# Patient Record
Sex: Female | Born: 1965
Health system: Southern US, Community
[De-identification: ages and names within clinical notes are randomized; demographics above are authoritative.]

## PROBLEM LIST (undated history)

## (undated) DIAGNOSIS — O24419 Gestational diabetes mellitus in pregnancy, unspecified control: Secondary | ICD-10-CM

## (undated) DIAGNOSIS — E119 Type 2 diabetes mellitus without complications: Secondary | ICD-10-CM

## (undated) DIAGNOSIS — S0500XA Injury of conjunctiva and corneal abrasion without foreign body, unspecified eye, initial encounter: Secondary | ICD-10-CM

## (undated) DIAGNOSIS — K219 Gastro-esophageal reflux disease without esophagitis: Secondary | ICD-10-CM

## (undated) DIAGNOSIS — M8580 Other specified disorders of bone density and structure, unspecified site: Secondary | ICD-10-CM

## (undated) DIAGNOSIS — K635 Polyp of colon: Secondary | ICD-10-CM

## (undated) HISTORY — DX: Other specified disorders of bone density and structure, unspecified site: M85.80

## (undated) HISTORY — DX: Injury of conjunctiva and corneal abrasion without foreign body, unspecified eye, initial encounter: S05.00XA

## (undated) HISTORY — DX: Gastro-esophageal reflux disease without esophagitis: K21.9

## (undated) HISTORY — DX: Gestational diabetes mellitus in pregnancy, unspecified control: O24.419

## (undated) HISTORY — DX: Type 2 diabetes mellitus without complications: E11.9

## (undated) HISTORY — PX: COLONOSCOPY: SHX174

## (undated) HISTORY — PX: NASAL SEPTUM SURGERY: SHX37

## (undated) HISTORY — DX: Polyp of colon: K63.5

---

## 2002-10-06 ENCOUNTER — Other Ambulatory Visit: Admission: RE | Admit: 2002-10-06 | Discharge: 2002-10-06 | Payer: Self-pay | Admitting: Obstetrics & Gynecology

## 2003-10-26 ENCOUNTER — Other Ambulatory Visit: Admission: RE | Admit: 2003-10-26 | Discharge: 2003-10-26 | Payer: Self-pay | Admitting: Obstetrics & Gynecology

## 2007-07-06 ENCOUNTER — Other Ambulatory Visit: Admission: RE | Admit: 2007-07-06 | Discharge: 2007-07-06 | Payer: Self-pay | Admitting: Gynecology

## 2007-08-19 ENCOUNTER — Ambulatory Visit (HOSPITAL_COMMUNITY): Admission: RE | Admit: 2007-08-19 | Discharge: 2007-08-19 | Payer: Self-pay | Admitting: Gynecology

## 2008-05-09 ENCOUNTER — Encounter: Admission: RE | Admit: 2008-05-09 | Discharge: 2008-05-09 | Payer: Self-pay | Admitting: Obstetrics & Gynecology

## 2008-07-16 ENCOUNTER — Inpatient Hospital Stay (HOSPITAL_COMMUNITY): Admission: AD | Admit: 2008-07-16 | Discharge: 2008-07-18 | Payer: Self-pay | Admitting: Obstetrics & Gynecology

## 2008-09-05 ENCOUNTER — Ambulatory Visit: Admission: RE | Admit: 2008-09-05 | Discharge: 2008-09-05 | Payer: Self-pay | Admitting: Obstetrics & Gynecology

## 2009-01-16 ENCOUNTER — Ambulatory Visit: Payer: Self-pay | Admitting: Internal Medicine

## 2009-01-30 ENCOUNTER — Encounter: Payer: Self-pay | Admitting: Internal Medicine

## 2009-01-30 ENCOUNTER — Ambulatory Visit: Payer: Self-pay | Admitting: Internal Medicine

## 2009-02-02 ENCOUNTER — Encounter: Payer: Self-pay | Admitting: Internal Medicine

## 2011-06-10 LAB — CBC
HCT: 38.1
HCT: 40.6
MCHC: 33.1
MCHC: 33.8
MCV: 89.5
RBC: 4.26
RDW: 14.2
RDW: 14.2

## 2011-06-10 LAB — GLUCOSE, CAPILLARY
Glucose-Capillary: 102 — ABNORMAL HIGH
Glucose-Capillary: 88

## 2011-06-10 LAB — RPR: RPR Ser Ql: NONREACTIVE

## 2011-08-30 ENCOUNTER — Emergency Department (HOSPITAL_COMMUNITY)
Admission: EM | Admit: 2011-08-30 | Discharge: 2011-08-30 | Disposition: A | Discharge status: Home / Self Care | Payer: 59 | Source: Home / Self Care

## 2011-08-30 ENCOUNTER — Encounter: Payer: Self-pay | Admitting: *Deleted

## 2011-08-30 DIAGNOSIS — S0502XA Injury of conjunctiva and corneal abrasion without foreign body, left eye, initial encounter: Secondary | ICD-10-CM

## 2011-08-30 DIAGNOSIS — S058X9A Other injuries of unspecified eye and orbit, initial encounter: Secondary | ICD-10-CM

## 2011-08-30 MED ORDER — TETANUS-DIPHTH-ACELL PERTUSSIS 5-2.5-18.5 LF-MCG/0.5 IM SUSP
0.5000 mL | Freq: Once | INTRAMUSCULAR | Status: AC
  Administered 2011-08-30: 0.5 mL via INTRAMUSCULAR

## 2011-08-30 MED ORDER — TETANUS-DIPHTH-ACELL PERTUSSIS 5-2.5-18.5 LF-MCG/0.5 IM SUSP
INTRAMUSCULAR | Status: AC
  Filled 2011-08-30: qty 0.5

## 2011-08-30 MED ORDER — HYDROCODONE-ACETAMINOPHEN 5-325 MG PO TABS: 1.0000 | ORAL_TABLET | Freq: Four times a day (QID) | ORAL | Status: AC | PRN

## 2011-08-30 MED ORDER — SULFACETAMIDE SODIUM 10 % OP OINT: TOPICAL_OINTMENT | OPHTHALMIC | Status: DC

## 2011-08-30 NOTE — ED Provider Notes (Signed)
History     CSN: [id number]  Arrival date & time 08/30/11  1720   None     Chief Complaint  Patient presents with  . Eye Injury    (Consider location/radiation/quality/duration/timing/severity/associated sxs/prior treatment) HPI Comments: Pt states just prior to arrival she was putting some laundry away at home .  She bent over and a plastic clothes hanger poked her in her Lt eye. She has eye pain since injury and vision is a little blurry. No other injury or complaints.   Patient is a 45 y.o. female presenting with eye injury.  Eye Injury Pertinent negatives include no chest pain.    History reviewed. No pertinent past medical history.  Past Surgical History  Procedure Date  . Nasal septum surgery     Family History  Problem Relation Age of Onset  . Hypertension Other   . Diabetes Other     History  Substance Use Topics  . Smoking status: Never Smoker   . Smokeless tobacco: Never Used  . Alcohol Use: Yes     Occasionally    OB History    Grav Para Term Preterm Abortions TAB SAB Ect Mult Living                  Review of Systems  HENT: Negative for ear pain and congestion.   Eyes: Positive for pain. Negative for photophobia, discharge, redness and visual disturbance.  Cardiovascular: Negative for chest pain.    Allergies  Review of patient's allergies indicates no known allergies.  Home Medications   Current Outpatient Rx  Name Route Sig Dispense Refill  . HYDROCODONE-ACETAMINOPHEN 5-325 MG PO TABS Oral Take 1 tablet by mouth every 6 (six) hours as needed for pain. 12 tablet 0  . SULFACETAMIDE SODIUM 10 % OP OINT  Instill a small ribbon of ointment into Lt lower eyelid 3-4 times a day 3.5 g 0    BP 145/85  Pulse 87  Temp(Src) 98 F (36.7 C) (Oral)  Resp 20  SpO2 100%  Physical Exam  Nursing note and vitals reviewed. Constitutional: She appears well-developed and well-nourished. No distress.  HENT:  Head: Normocephalic and atraumatic.    Right Ear: Tympanic membrane, external ear and ear canal normal.  Left Ear: Tympanic membrane, external ear and ear canal normal.  Nose: Nose normal.  Mouth/Throat: Uvula is midline, oropharynx is clear and moist and mucous membranes are normal. No oropharyngeal exudate, posterior oropharyngeal edema or posterior oropharyngeal erythema.  Eyes: Conjunctivae, EOM and lids are normal. Pupils are equal, round, and reactive to light.  Slit lamp exam:      The left eye shows corneal abrasion.    Neck: Neck supple.  Cardiovascular: Normal rate, regular rhythm and normal heart sounds.   Pulmonary/Chest: Effort normal and breath sounds normal. No respiratory distress.  Lymphadenopathy:    She has no cervical adenopathy.  Neurological: She is alert.  Skin: Skin is warm and dry.  Psychiatric: She has a normal mood and affect.    ED Course  Procedures (including critical care time)  Labs Reviewed - No data to display No results found.   1. Corneal abrasion, left       MDM   Corneal abrasion Lt eye noted. Pt advised recommended f/u in 48 hrs. She is leaving out of state tomorrow and is uncertain if she will have recheck out of state or wait until she returns. Discussed if she has any worsening or change to have recheck no matter  where she is at.        Melody Comas, [place] 08/30/11 2119

## 2011-08-30 NOTE — ED Notes (Addendum)
Pt c/o LEFT eye pain s/p "leaning over and a plastic clothes hanger poked my eye. I think my sclera is torn." Minimal redness noted to this eye. Reports her vision is "a little off".

## 2011-08-30 NOTE — ED Notes (Signed)
Erythromycin ointment given in place of sulfacetamide ointment per Harold Hedge PA

## 2011-08-31 NOTE — ED Provider Notes (Signed)
Medical screening examination/treatment/procedure(s) were performed by non-physician practitioner and as supervising physician I was immediately available for consultation/collaboration.  Raynald Blend, MD 08/31/11 1029

## 2012-02-23 ENCOUNTER — Other Ambulatory Visit: Payer: Self-pay | Admitting: Obstetrics & Gynecology

## 2012-02-23 DIAGNOSIS — Z1231 Encounter for screening mammogram for malignant neoplasm of breast: Secondary | ICD-10-CM

## 2012-03-22 ENCOUNTER — Ambulatory Visit
Admission: RE | Admit: 2012-03-22 | Discharge: 2012-03-22 | Disposition: A | Discharge status: Home / Self Care | Payer: 59 | Source: Ambulatory Visit | Attending: Obstetrics & Gynecology | Admitting: Obstetrics & Gynecology

## 2012-03-22 DIAGNOSIS — Z1231 Encounter for screening mammogram for malignant neoplasm of breast: Secondary | ICD-10-CM

## 2012-03-24 ENCOUNTER — Ambulatory Visit: Payer: 59

## 2012-03-25 ENCOUNTER — Ambulatory Visit: Payer: 59

## 2013-03-14 ENCOUNTER — Other Ambulatory Visit: Payer: Self-pay

## 2013-03-14 DIAGNOSIS — Z1231 Encounter for screening mammogram for malignant neoplasm of breast: Secondary | ICD-10-CM

## 2013-04-06 ENCOUNTER — Ambulatory Visit
Admission: RE | Admit: 2013-04-06 | Discharge: 2013-04-06 | Disposition: A | Discharge status: Home / Self Care | Payer: 59 | Source: Ambulatory Visit

## 2013-04-06 DIAGNOSIS — Z1231 Encounter for screening mammogram for malignant neoplasm of breast: Secondary | ICD-10-CM

## 2013-11-29 ENCOUNTER — Encounter: Payer: Self-pay | Admitting: Internal Medicine

## 2014-04-21 ENCOUNTER — Other Ambulatory Visit: Payer: Self-pay

## 2014-04-21 DIAGNOSIS — Z1231 Encounter for screening mammogram for malignant neoplasm of breast: Secondary | ICD-10-CM

## 2014-05-02 ENCOUNTER — Encounter: Payer: Self-pay | Admitting: Internal Medicine

## 2014-05-04 ENCOUNTER — Ambulatory Visit
Admission: RE | Admit: 2014-05-04 | Discharge: 2014-05-04 | Disposition: A | Discharge status: Home / Self Care | Payer: 59 | Source: Ambulatory Visit

## 2014-05-04 DIAGNOSIS — Z1231 Encounter for screening mammogram for malignant neoplasm of breast: Secondary | ICD-10-CM

## 2016-01-03 ENCOUNTER — Encounter: Payer: Self-pay | Admitting: Gastroenterology

## 2016-03-10 IMAGING — MG MM SCREENING BREAST TOMO BILATERAL
6 of 9 series · 6 of 25 positions shown · non-contrast
Comparison: Previous exam(s).

CLINICAL DATA: Screening.

EXAM:
DIGITAL SCREENING BILATERAL MAMMOGRAM WITH 3D TOMO WITH CAD

[L MLO (1 of 2)]
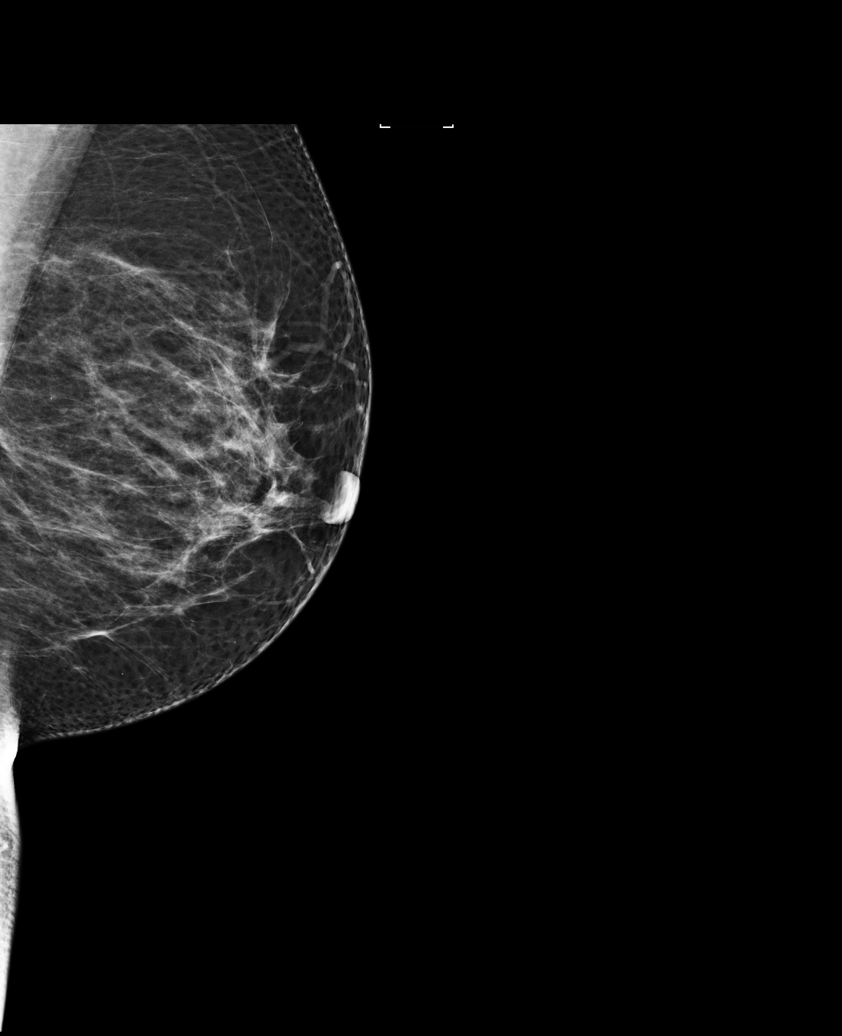

[L CC]
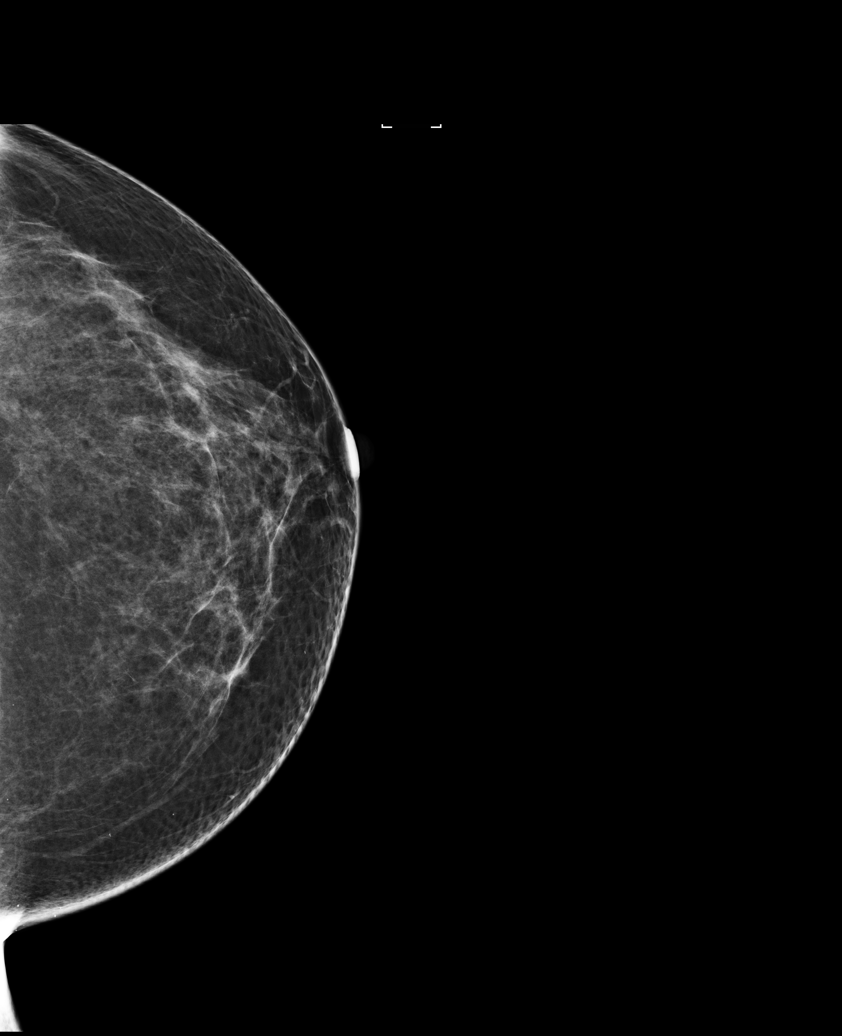

[R CC]
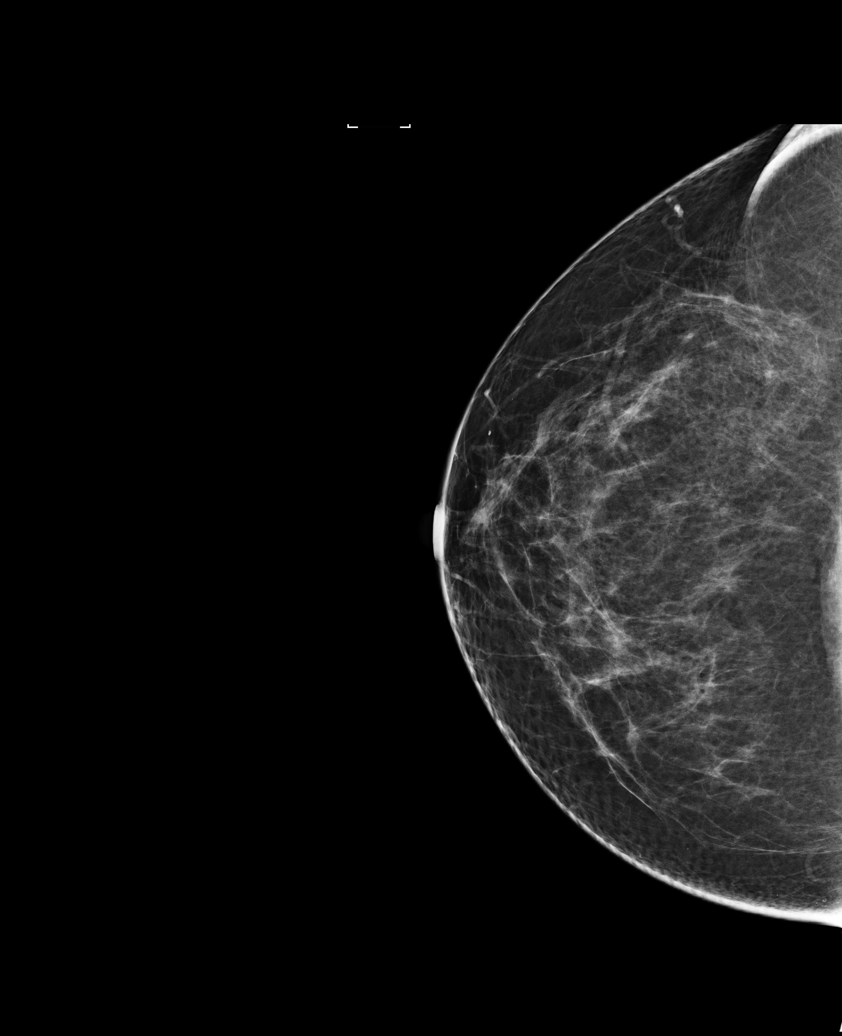

[L MLO (2 of 2)]
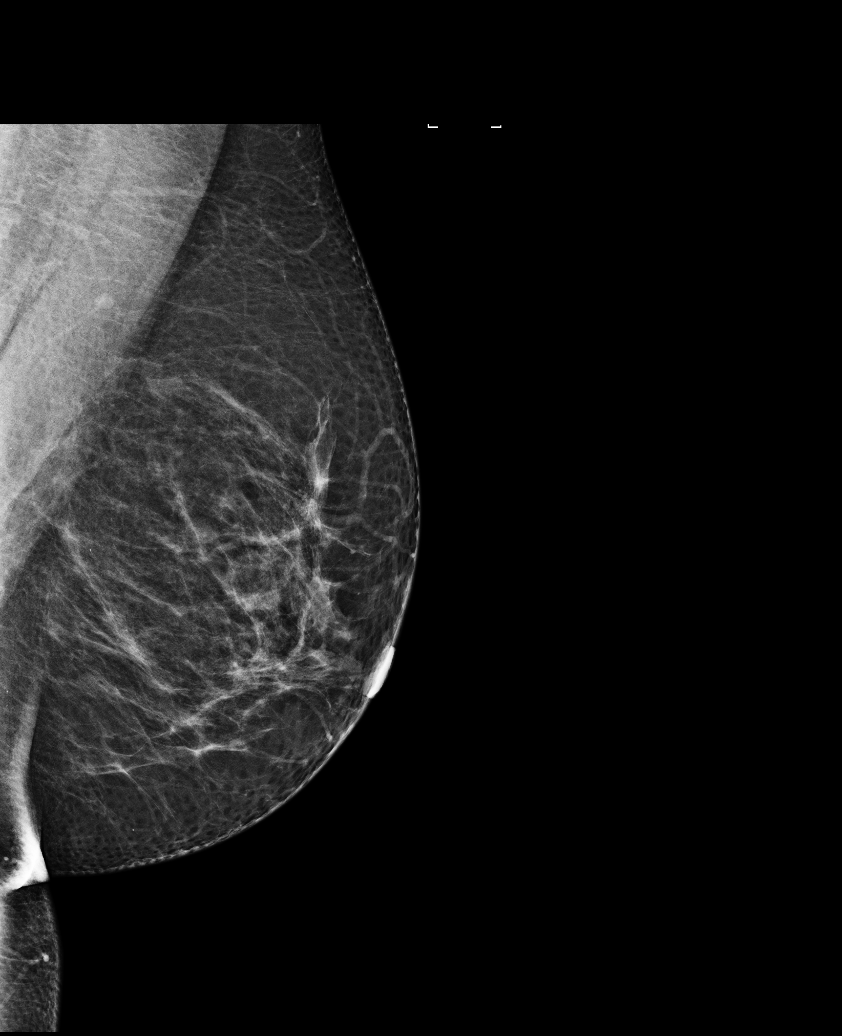

[R MLO]
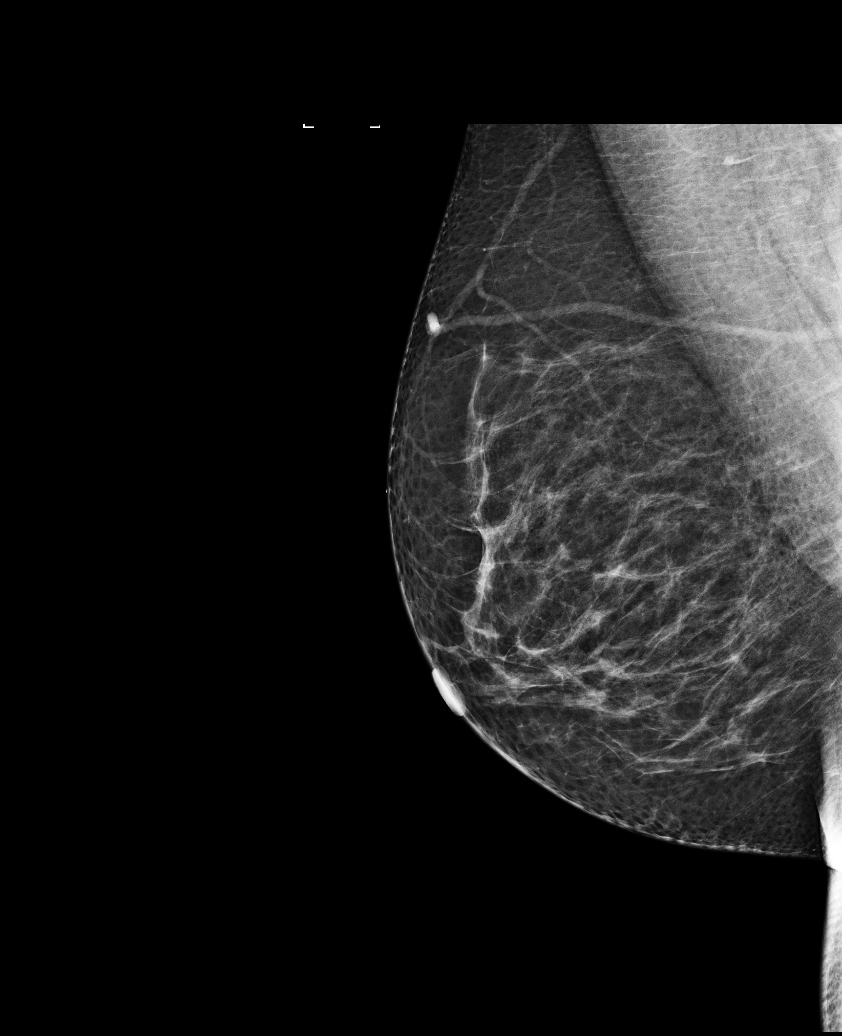

[R MLO tomo · tomo slice 47/94.0]
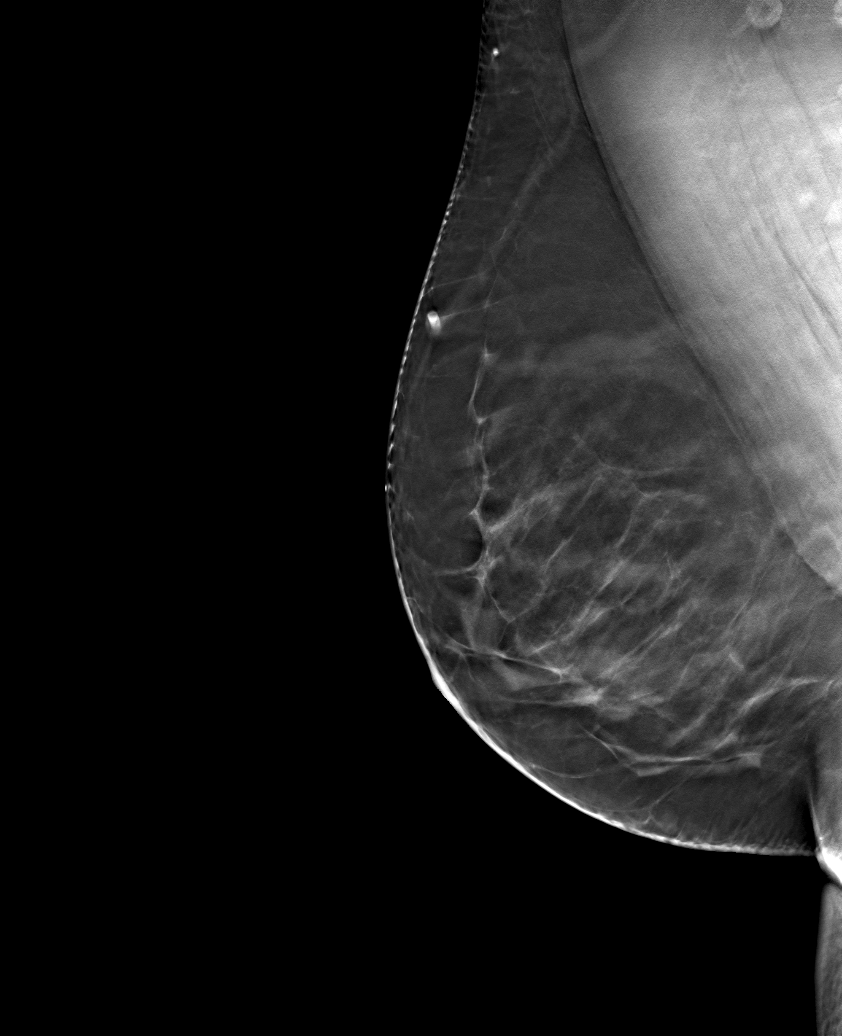

[6 of 25 positions shown; findings below may reference images not displayed]

ACR Breast Density Category b: There are scattered areas of
fibroglandular density.
FINDINGS: There are no findings suspicious for malignancy. Images were
processed with CAD.
IMPRESSION: No mammographic evidence of malignancy. A result letter of this
screening mammogram will be mailed directly to the patient.

RECOMMENDATION:
Screening mammogram in one year. (Code:55-L-23V)

BI-RADS CATEGORY  1: Negative.

## 2016-06-04 ENCOUNTER — Encounter: Payer: Self-pay | Admitting: Obstetrics & Gynecology

## 2016-09-18 ENCOUNTER — Encounter: Payer: Self-pay | Admitting: Gastroenterology

## 2016-11-04 ENCOUNTER — Ambulatory Visit (AMBULATORY_SURGERY_CENTER): Payer: Self-pay

## 2016-11-04 VITALS — Ht 66.5 in | Wt 220.8 lb

## 2016-11-04 DIAGNOSIS — Z800 Family history of malignant neoplasm of digestive organs: Secondary | ICD-10-CM

## 2016-11-04 MED ORDER — SUPREP BOWEL PREP KIT 17.5-3.13-1.6 GM/177ML PO SOLN: 1.0000 | Freq: Once | ORAL | 0 refills | Status: AC

## 2016-11-04 NOTE — Progress Notes (Signed)
No allergies to eggs or soy No past problems with anesthesia No diet meds No home oxygen  Declined emmi 

## 2016-11-05 ENCOUNTER — Encounter: Payer: Self-pay | Admitting: Gastroenterology

## 2016-11-18 ENCOUNTER — Ambulatory Visit (AMBULATORY_SURGERY_CENTER): Payer: 59 | Admitting: Gastroenterology

## 2016-11-18 ENCOUNTER — Encounter: Payer: Self-pay | Admitting: Gastroenterology

## 2016-11-18 VITALS — BP 107/62 | HR 54 | Temp 98.0°F | Resp 12 | Ht 66.0 in | Wt 220.0 lb

## 2016-11-18 DIAGNOSIS — Z1211 Encounter for screening for malignant neoplasm of colon: Secondary | ICD-10-CM

## 2016-11-18 DIAGNOSIS — Z1212 Encounter for screening for malignant neoplasm of rectum: Secondary | ICD-10-CM | POA: Diagnosis not present

## 2016-11-18 DIAGNOSIS — K635 Polyp of colon: Secondary | ICD-10-CM

## 2016-11-18 DIAGNOSIS — D125 Benign neoplasm of sigmoid colon: Secondary | ICD-10-CM

## 2016-11-18 DIAGNOSIS — Z800 Family history of malignant neoplasm of digestive organs: Secondary | ICD-10-CM | POA: Diagnosis not present

## 2016-11-18 MED ORDER — SODIUM CHLORIDE 0.9 % IV SOLN: 500.0000 mL | INTRAVENOUS | Status: DC

## 2016-11-18 NOTE — Patient Instructions (Signed)
YOU HAD AN ENDOSCOPIC PROCEDURE TODAY AT THE [facility]:   Refer to the procedure report that was given to you for any specific questions about what was found during the examination.  If the procedure report does not answer your questions, please call your gastroenterologist to clarify.  If you requested that your care partner not be given the details of your procedure findings, then the procedure report has been included in a sealed envelope for you to review at your convenience later.  YOU SHOULD EXPECT: Some feelings of bloating in the abdomen. Passage of more gas than usual.  Walking can help get rid of the air that was put into your GI tract during the procedure and reduce the bloating. If you had a lower endoscopy (such as a colonoscopy or flexible sigmoidoscopy) you may notice spotting of blood in your stool or on the toilet paper. If you underwent a bowel prep for your procedure, you may not have a normal bowel movement for a few days.  Please Note:  You might notice some irritation and congestion in your nose or some drainage.  This is from the oxygen used during your procedure.  There is no need for concern and it should clear up in a day or so.  SYMPTOMS TO REPORT IMMEDIATELY:   Following lower endoscopy (colonoscopy or flexible sigmoidoscopy):  Excessive amounts of blood in the stool  Significant tenderness or worsening of abdominal pains  Swelling of the abdomen that is new, acute  Fever of 100F or higher    For urgent or emergent issues, a gastroenterologist can be reached at any hour by calling [phone].   DIET:  We do recommend a small meal at first, but then you may proceed to your regular diet.  Drink plenty of fluids but you should avoid alcoholic beverages for 24 hours.  ACTIVITY:  You should plan to take it easy for the rest of today and you should NOT DRIVE or use heavy machinery until tomorrow (because of the sedation medicines used during the test).     FOLLOW UP: Our staff will call the number listed on your records the next business day following your procedure to check on you and address any questions or concerns that you may have regarding the information given to you following your procedure. If we do not reach you, we will leave a message.  However, if you are feeling well and you are not experiencing any problems, there is no need to return our call.  We will assume that you have returned to your regular daily activities without incident.  If any biopsies were taken you will be contacted by phone or by letter within the next 1-3 weeks.  Please call us at [phone] if you have not heard about the biopsies in 3 weeks.    SIGNATURES/CONFIDENTIALITY: You and/or your care partner have signed paperwork which will be entered into your electronic medical record.  These signatures attest to the fact that that the information above on your After Visit Summary has been reviewed and is understood.  Full responsibility of the confidentiality of this discharge information lies with you and/or your care-partner  Polyp and hemorrhoid information given.. 

## 2016-11-18 NOTE — Progress Notes (Signed)
Called to room to assist during endoscopic procedure.  Patient ID and intended procedure confirmed with present staff. Received instructions for my participation in the procedure from the performing physician.  

## 2016-11-18 NOTE — Progress Notes (Signed)
Report to PACU, RN, vss, BBS= Clear.  

## 2016-11-18 NOTE — Progress Notes (Signed)
Pt's states no medical or surgical changes since previsit or office visit. Maw

## 2016-11-18 NOTE — Op Note (Signed)
[facility] Patient Name: Jacqueline Cordova Procedure Date: 11/18/2016 9:42 AM MRN: [id number] Endoscopist: Mauri Pole , MD Age: 51 Referring MD:  Date of Birth: 03-Jan-1966 Gender: Female Account #: [id number] Procedure:                Colonoscopy Indications:              Colon cancer screening in patient at increased                            risk: Family history of colorectal cancer in                            multiple 2nd degree relatives (Maternal                            grandparents) Medicines:                Monitored Anesthesia Care Procedure:                Pre-Anesthesia Assessment:                           - Prior to the procedure, a History and Physical                            was performed, and patient medications and                            allergies were reviewed. The patient's tolerance of                            previous anesthesia was also reviewed. The risks                            and benefits of the procedure and the sedation                            options and risks were discussed with the patient.                            All questions were answered, and informed consent                            was obtained. Prior Anticoagulants: The patient has                            taken no previous anticoagulant or antiplatelet                            agents. ASA Grade Assessment: II - A patient with                            mild systemic disease. After reviewing the risks  and benefits, the patient was deemed in                            satisfactory condition to undergo the procedure.                           After obtaining informed consent, the colonoscope                            was passed under direct vision. Throughout the                            procedure, the patient's blood pressure, pulse, and                            oxygen saturations were monitored continuously. The                      Colonoscope was introduced through the anus and                            advanced to the the cecum, identified by                            appendiceal orifice and ileocecal valve. The                            colonoscopy was performed without difficulty. The                            patient tolerated the procedure well. The quality                            of the bowel preparation was excellent. The                            terminal ileum, ileocecal valve, appendiceal                            orifice, and rectum were photographed. Scope In: 9:43:52 AM Scope Out: 9:59:36 AM Scope Withdrawal Time: 0 hours 10 minutes 35 seconds  Total Procedure Duration: 0 hours 15 minutes 44 seconds  Findings:                 The perianal and digital rectal examinations were                            normal.                           A 4 mm polyp was found in the sigmoid colon. The                            polyp was sessile. The polyp was removed with a  cold snare. Resection and retrieval were complete.                           Non-bleeding internal hemorrhoids were found during                            retroflexion. The hemorrhoids were medium-sized.                           The exam was otherwise without abnormality. Complications:            No immediate complications. Estimated Blood Loss:     Estimated blood loss was minimal. Impression:               - One 4 mm polyp in the sigmoid colon, removed with                            a cold snare. Resected and retrieved.                           - Non-bleeding internal hemorrhoids.                           - The examination was otherwise normal. Recommendation:           - Patient has a contact number available for                            emergencies. The signs and symptoms of potential                            delayed complications were discussed with the                             patient. Return to normal activities tomorrow.                            Written discharge instructions were provided to the                            patient.                           - Resume previous diet.                           - Continue present medications.                           - Await pathology results.                           - Repeat colonoscopy in 5-10 years for surveillance                            based on pathology results.                           -  Return to GI clinic PRN. Mauri Pole, MD 11/18/2016 10:04:07 AM This report has been signed electronically.

## 2016-11-19 ENCOUNTER — Telehealth: Payer: Self-pay | Admitting: *Deleted

## 2016-11-19 NOTE — Telephone Encounter (Signed)
  Follow up Call-  Call back number 11/18/2016  Post procedure Call Back phone  # [phone] cell  Permission to leave phone message Yes  Some recent data might be hidden     Patient questions:  Do you have a fever, pain , or abdominal swelling? No. Pain Score  0 *  Have you tolerated food without any problems? Yes.    Have you been able to return to your normal activities? Yes.    Do you have any questions about your discharge instructions: Diet   No. Medications  No. Follow up visit  No.  Do you have questions or concerns about your Care? No.  Actions: * If pain score is 4 or above: No action needed, pain <4.

## 2016-11-24 ENCOUNTER — Encounter: Payer: Self-pay | Admitting: Gastroenterology

## 2017-08-19 ENCOUNTER — Encounter: Payer: Self-pay | Admitting: Obstetrics & Gynecology

## 2017-09-08 DIAGNOSIS — M8580 Other specified disorders of bone density and structure, unspecified site: Secondary | ICD-10-CM

## 2017-09-08 HISTORY — DX: Other specified disorders of bone density and structure, unspecified site: M85.80

## 2017-09-15 ENCOUNTER — Other Ambulatory Visit: Payer: Self-pay | Admitting: Anesthesiology

## 2017-09-15 ENCOUNTER — Encounter: Payer: Self-pay | Admitting: Obstetrics & Gynecology

## 2017-09-15 ENCOUNTER — Ambulatory Visit (INDEPENDENT_AMBULATORY_CARE_PROVIDER_SITE_OTHER): Payer: 59 | Admitting: Obstetrics & Gynecology

## 2017-09-15 VITALS — BP 124/76 | Ht 66.5 in | Wt 223.0 lb

## 2017-09-15 DIAGNOSIS — Z1151 Encounter for screening for human papillomavirus (HPV): Secondary | ICD-10-CM | POA: Diagnosis not present

## 2017-09-15 DIAGNOSIS — Z01419 Encounter for gynecological examination (general) (routine) without abnormal findings: Secondary | ICD-10-CM

## 2017-09-15 DIAGNOSIS — Z30431 Encounter for routine checking of intrauterine contraceptive device: Secondary | ICD-10-CM | POA: Diagnosis not present

## 2017-09-15 DIAGNOSIS — Z13820 Encounter for screening for osteoporosis: Secondary | ICD-10-CM | POA: Diagnosis not present

## 2017-09-15 DIAGNOSIS — Z780 Asymptomatic menopausal state: Secondary | ICD-10-CM | POA: Diagnosis not present

## 2017-09-15 DIAGNOSIS — R87610 Atypical squamous cells of undetermined significance on cytologic smear of cervix (ASC-US): Secondary | ICD-10-CM | POA: Diagnosis not present

## 2017-09-15 NOTE — Progress Notes (Signed)
Jacqueline Cordova 1965-11-01 [id number]   History:    52 y.o. G1P1 married.  Daughter 52 yo, doing very well.  RP:  Established patient presenting  for annual gyn exam   HPI:  Well on Mirena IUD x 04/2014.  No menstrual period on Mirena.  No pelvic pain.  Normal vaginal secretions.  No hot flashes or night sweats, but FSH in 2015 was borderline at 30.  Urine and bowel movements normal.  Breasts normal.  Will schedule screening mammogram.  Will follow up here fasting for health labs.  Planning to exercise more regularly.  Body mass index 35.45.  Past medical history,surgical history, family history and social history were all reviewed and documented in the EPIC chart.  Gynecologic History No LMP recorded. Patient is not currently having periods (Reason: IUD). Contraception: IUD Last Pap: 2016. Results were: Neg/HPV HR neg Last mammogram: 2017 Results were: normal  Obstetric History OB History  Gravida Para Term Preterm AB Living  1 1       1   SAB TAB Ectopic Multiple Live Births               # Outcome Date GA Lbr Len/2nd Weight Sex Delivery Anes PTL Lv  1 Para                ROS: A ROS was performed and pertinent positives and negatives are included in the history.  GENERAL: No fevers or chills. HEENT: No change in vision, no earache, sore throat or sinus congestion. NECK: No pain or stiffness. CARDIOVASCULAR: No chest pain or pressure. No palpitations. PULMONARY: No shortness of breath, cough or wheeze. GASTROINTESTINAL: No abdominal pain, nausea, vomiting or diarrhea, melena or bright red blood per rectum. GENITOURINARY: No urinary frequency, urgency, hesitancy or dysuria. MUSCULOSKELETAL: No joint or muscle pain, no back pain, no recent trauma. DERMATOLOGIC: No rash, no itching, no lesions. ENDOCRINE: No polyuria, polydipsia, no heat or cold intolerance. No recent change in weight. HEMATOLOGICAL: No anemia or easy bruising or bleeding. NEUROLOGIC: No headache, seizures, numbness,  tingling or weakness. PSYCHIATRIC: No depression, no loss of interest in normal activity or change in sleep pattern.     Exam:   BP 124/76   Ht 5' 6.5" (1.689 m)   Wt 223 lb (101.2 kg)   BMI 35.45 kg/m   Body mass index is 35.45 kg/m.  General appearance : Well developed well nourished female. No acute distress HEENT: Eyes: no retinal hemorrhage or exudates,  Neck supple, trachea midline, no carotid bruits, no thyroidmegaly Lungs: Clear to auscultation, no rhonchi or wheezes, or rib retractions  Heart: Regular rate and rhythm, no murmurs or gallops Breast:Examined in sitting and supine position were symmetrical in appearance, no palpable masses or tenderness,  no skin retraction, no nipple inversion, no nipple discharge, no skin discoloration, no axillary or supraclavicular lymphadenopathy Abdomen: no palpable masses or tenderness, no rebound or guarding Extremities: no edema or skin discoloration or tenderness  Pelvic: Vulva normal  Bartholin, Urethra, Skene Glands: Within normal limits             Vagina: No gross lesions or discharge  Cervix: No gross lesions or discharge.  Pap/HR HPV done.  IUD strings seen.  Uterus  AV, normal size, shape and consistency, non-tender and mobile  Adnexa  Without masses or tenderness  Anus and perineum  normal     Assessment/Plan:  52 y.o. female for annual exam   1. Encounter for routine gynecological examination with Papanicolaou  smear of cervix Normal gynecologic exam.  Pap test with high risk HPV done.  Breast exam normal.  Will schedule screening mammogram.  Follow-up here for fasting health labs.  Did colonoscopy in 2018.  2. Encounter for routine checking of intrauterine contraceptive device (IUD) Mirena IUD well-tolerated and in good position.  3. Menopause present Probably in menopause, but not confirmed.  4. Screening for osteoporosis  - DG Bone Density; Future  Princess Bruins MD, 12:21 PM 09/15/2017

## 2017-09-15 NOTE — Patient Instructions (Signed)
1. Encounter for routine gynecological examination with Papanicolaou smear of cervix Normal gynecologic exam.  Pap test with high risk HPV done.  Breast exam normal.  Will schedule screening mammogram.  Follow-up here for fasting health labs.  Did colonoscopy in 2018.  2. Encounter for routine checking of intrauterine contraceptive device (IUD) Mirena IUD well-tolerated and in good position.  3. Menopause present Probably in menopause, but not confirmed.  4. Screening for osteoporosis  - DG Bone Density; Future  Jacqueline Cordova, it was a real pleasure seeing you today!  I will inform you of your results as soon as they are available.  Health Maintenance, Female Adopting a healthy lifestyle and getting preventive care can go a long way to promote health and wellness. Talk with your health care provider about what schedule of regular examinations is right for you. This is a good chance for you to check in with your provider about disease prevention and staying healthy. In between checkups, there are plenty of things you can do on your own. Experts have done a lot of research about which lifestyle changes and preventive measures are most likely to keep you healthy. Ask your health care provider for more information. Weight and diet Eat a healthy diet  Be sure to include plenty of vegetables, fruits, low-fat dairy products, and lean protein.  Do not eat a lot of foods high in solid fats, added sugars, or salt.  Get regular exercise. This is one of the most important things you can do for your health. ? Most adults should exercise for at least 150 minutes each week. The exercise should increase your heart rate and make you sweat (moderate-intensity exercise). ? Most adults should also do strengthening exercises at least twice a week. This is in addition to the moderate-intensity exercise.  Maintain a healthy weight  Body mass index (BMI) is a measurement that can be used to identify possible weight  problems. It estimates body fat based on height and weight. Your health care provider can help determine your BMI and help you achieve or maintain a healthy weight.  For females 20 years of age and older: ? A BMI below 18.5 is considered underweight. ? A BMI of 18.5 to 24.9 is normal. ? A BMI of 25 to 29.9 is considered overweight. ? A BMI of 30 and above is considered obese.  Watch levels of cholesterol and blood lipids  You should start having your blood tested for lipids and cholesterol at 50 years of age, then have this test every 5 years.  You may need to have your cholesterol levels checked more often if: ? Your lipid or cholesterol levels are high. ? You are older than 50 years of age. ? You are at high risk for heart disease.  Cancer screening Lung Cancer  Lung cancer screening is recommended for adults 55-80 years old who are at high risk for lung cancer because of a history of smoking.  A yearly low-dose CT scan of the lungs is recommended for people who: ? Currently smoke. ? Have quit within the past 15 years. ? Have at least a 30-pack-year history of smoking. A pack year is smoking an average of one pack of cigarettes a day for 1 year.  Yearly screening should continue until it has been 15 years since you quit.  Yearly screening should stop if you develop a health problem that would prevent you from having lung cancer treatment.  Breast Cancer  Practice breast self-awareness. This means understanding   how your breasts normally appear and feel.  It also means doing regular breast self-exams. Let your health care provider know about any changes, no matter how small.  If you are in your 20s or 30s, you should have a clinical breast exam (CBE) by a health care provider every 1-3 years as part of a regular health exam.  If you are 40 or older, have a CBE every year. Also consider having a breast X-ray (mammogram) every year.  If you have a family history of breast  cancer, talk to your health care provider about genetic screening.  If you are at high risk for breast cancer, talk to your health care provider about having an MRI and a mammogram every year.  Breast cancer gene (BRCA) assessment is recommended for women who have family members with BRCA-related cancers. BRCA-related cancers include: ? Breast. ? Ovarian. ? Tubal. ? Peritoneal cancers.  Results of the assessment will determine the need for genetic counseling and BRCA1 and BRCA2 testing.  Cervical Cancer Your health care provider may recommend that you be screened regularly for cancer of the pelvic organs (ovaries, uterus, and vagina). This screening involves a pelvic examination, including checking for microscopic changes to the surface of your cervix (Pap test). You may be encouraged to have this screening done every 3 years, beginning at age 21.  For women ages 30-65, health care providers may recommend pelvic exams and Pap testing every 3 years, or they may recommend the Pap and pelvic exam, combined with testing for human papilloma virus (HPV), every 5 years. Some types of HPV increase your risk of cervical cancer. Testing for HPV may also be done on women of any age with unclear Pap test results.  Other health care providers may not recommend any screening for nonpregnant women who are considered low risk for pelvic cancer and who do not have symptoms. Ask your health care provider if a screening pelvic exam is right for you.  If you have had past treatment for cervical cancer or a condition that could lead to cancer, you need Pap tests and screening for cancer for at least 20 years after your treatment. If Pap tests have been discontinued, your risk factors (such as having a new sexual partner) need to be reassessed to determine if screening should resume. Some women have medical problems that increase the chance of getting cervical cancer. In these cases, your health care provider may  recommend more frequent screening and Pap tests.  Colorectal Cancer  This type of cancer can be detected and often prevented.  Routine colorectal cancer screening usually begins at 50 years of age and continues through 50 years of age.  Your health care provider may recommend screening at an earlier age if you have risk factors for colon cancer.  Your health care provider may also recommend using home test kits to check for hidden blood in the stool.  A small camera at the end of a tube can be used to examine your colon directly (sigmoidoscopy or colonoscopy). This is done to check for the earliest forms of colorectal cancer.  Routine screening usually begins at age 50.  Direct examination of the colon should be repeated every 5-10 years through 50 years of age. However, you may need to be screened more often if early forms of precancerous polyps or small growths are found.  Skin Cancer  Check your skin from head to toe regularly.  Tell your health care provider about any new moles or   changes in moles, especially if there is a change in a mole's shape or color.  Also tell your health care provider if you have a mole that is larger than the size of a pencil eraser.  Always use sunscreen. Apply sunscreen liberally and repeatedly throughout the day.  Protect yourself by wearing long sleeves, pants, a wide-brimmed hat, and sunglasses whenever you are outside.  Heart disease, diabetes, and high blood pressure  High blood pressure causes heart disease and increases the risk of stroke. High blood pressure is more likely to develop in: ? People who have blood pressure in the high end of the normal range (130-139/85-89 mm Hg). ? People who are overweight or obese. ? People who are African American.  If you are 18-39 years of age, have your blood pressure checked every 3-5 years. If you are 40 years of age or older, have your blood pressure checked every year. You should have your blood  pressure measured twice-once when you are at a hospital or clinic, and once when you are not at a hospital or clinic. Record the average of the two measurements. To check your blood pressure when you are not at a hospital or clinic, you can use: ? An automated blood pressure machine at a pharmacy. ? A home blood pressure monitor.  If you are between 55 years and 79 years old, ask your health care provider if you should take aspirin to prevent strokes.  Have regular diabetes screenings. This involves taking a blood sample to check your fasting blood sugar level. ? If you are at a normal weight and have a low risk for diabetes, have this test once every three years after 50 years of age. ? If you are overweight and have a high risk for diabetes, consider being tested at a younger age or more often. Preventing infection Hepatitis B  If you have a higher risk for hepatitis B, you should be screened for this virus. You are considered at high risk for hepatitis B if: ? You were born in a country where hepatitis B is common. Ask your health care provider which countries are considered high risk. ? Your parents were born in a high-risk country, and you have not been immunized against hepatitis B (hepatitis B vaccine). ? You have HIV or AIDS. ? You use needles to inject street drugs. ? You live with someone who has hepatitis B. ? You have had sex with someone who has hepatitis B. ? You get hemodialysis treatment. ? You take certain medicines for conditions, including cancer, organ transplantation, and autoimmune conditions.  Hepatitis C  Blood testing is recommended for: ? Everyone born from 1945 through 1965. ? Anyone with known risk factors for hepatitis C.  Sexually transmitted infections (STIs)  You should be screened for sexually transmitted infections (STIs) including gonorrhea and chlamydia if: ? You are sexually active and are younger than 50 years of age. ? You are older than 50 years  of age and your health care provider tells you that you are at risk for this type of infection. ? Your sexual activity has changed since you were last screened and you are at an increased risk for chlamydia or gonorrhea. Ask your health care provider if you are at risk.  If you do not have HIV, but are at risk, it may be recommended that you take a prescription medicine daily to prevent HIV infection. This is called pre-exposure prophylaxis (PrEP). You are considered at risk if: ?   You are sexually active and do not regularly use condoms or know the HIV status of your partner(s). ? You take drugs by injection. ? You are sexually active with a partner who has HIV.  Talk with your health care provider about whether you are at high risk of being infected with HIV. If you choose to begin PrEP, you should first be tested for HIV. You should then be tested every 3 months for as long as you are taking PrEP. Pregnancy  If you are premenopausal and you may become pregnant, ask your health care provider about preconception counseling.  If you may become pregnant, take 400 to 800 micrograms (mcg) of folic acid every day.  If you want to prevent pregnancy, talk to your health care provider about birth control (contraception). Osteoporosis and menopause  Osteoporosis is a disease in which the bones lose minerals and strength with aging. This can result in serious bone fractures. Your risk for osteoporosis can be identified using a bone density scan.  If you are 65 years of age or older, or if you are at risk for osteoporosis and fractures, ask your health care provider if you should be screened.  Ask your health care provider whether you should take a calcium or vitamin D supplement to lower your risk for osteoporosis.  Menopause may have certain physical symptoms and risks.  Hormone replacement therapy may reduce some of these symptoms and risks. Talk to your health care provider about whether hormone  replacement therapy is right for you. Follow these instructions at home:  Schedule regular health, dental, and eye exams.  Stay current with your immunizations.  Do not use any tobacco products including cigarettes, chewing tobacco, or electronic cigarettes.  If you are pregnant, do not drink alcohol.  If you are breastfeeding, limit how much and how often you drink alcohol.  Limit alcohol intake to no more than 1 drink per day for nonpregnant women. One drink equals 12 ounces of beer, 5 ounces of wine, or 1 ounces of hard liquor.  Do not use street drugs.  Do not share needles.  Ask your health care provider for help if you need support or information about quitting drugs.  Tell your health care provider if you often feel depressed.  Tell your health care provider if you have ever been abused or do not feel safe at home. This information is not intended to replace advice given to you by your health care provider. Make sure you discuss any questions you have with your health care provider. Document Released: 03/10/2011 Document Revised: 01/31/2016 Document Reviewed: 05/29/2015 Elsevier Interactive Patient Education  2018 [employer] Inc[employer]  

## 2017-09-16 ENCOUNTER — Other Ambulatory Visit: Payer: Self-pay | Admitting: Gynecology

## 2017-09-16 DIAGNOSIS — Z13820 Encounter for screening for osteoporosis: Secondary | ICD-10-CM

## 2017-09-16 LAB — PAP, TP IMAGING W/ HPV RNA, RFLX HPV TYPE 16,18/45: HPV DNA High Risk: NOT DETECTED

## 2017-10-08 ENCOUNTER — Other Ambulatory Visit: Payer: 59

## 2017-10-08 ENCOUNTER — Ambulatory Visit (INDEPENDENT_AMBULATORY_CARE_PROVIDER_SITE_OTHER): Payer: 59

## 2017-10-08 DIAGNOSIS — Z13820 Encounter for screening for osteoporosis: Secondary | ICD-10-CM

## 2017-10-08 DIAGNOSIS — Z01419 Encounter for gynecological examination (general) (routine) without abnormal findings: Secondary | ICD-10-CM | POA: Diagnosis not present

## 2017-10-08 DIAGNOSIS — M8588 Other specified disorders of bone density and structure, other site: Secondary | ICD-10-CM

## 2017-10-09 ENCOUNTER — Encounter: Payer: Self-pay | Admitting: Gynecology

## 2017-10-11 LAB — COMPREHENSIVE METABOLIC PANEL
AG Ratio: 2.4 (calc) (ref 1.0–2.5)
ALBUMIN MSPROF: 4.7 g/dL (ref 3.6–5.1)
ALKALINE PHOSPHATASE (APISO): 94 U/L (ref 33–115)
ALT: 33 U/L — ABNORMAL HIGH (ref 6–29)
AST: 21 U/L (ref 10–35)
BILIRUBIN TOTAL: 1.4 mg/dL — AB (ref 0.2–1.2)
BUN: 15 mg/dL (ref 7–25)
CALCIUM: 9.6 mg/dL (ref 8.6–10.2)
CHLORIDE: 104 mmol/L (ref 98–110)
CO2: 23 mmol/L (ref 20–32)
Creat: 0.95 mg/dL (ref 0.50–1.10)
GLOBULIN: 2 g/dL (ref 1.9–3.7)
Glucose, Bld: 108 mg/dL — ABNORMAL HIGH (ref 65–99)
POTASSIUM: 4.1 mmol/L (ref 3.5–5.3)
Sodium: 137 mmol/L (ref 135–146)
Total Protein: 6.7 g/dL (ref 6.1–8.1)

## 2017-10-11 LAB — LIPID PANEL
Cholesterol: 201 mg/dL — ABNORMAL HIGH (ref <200)
HDL: 48 mg/dL — ABNORMAL LOW (ref >50)
LDL Cholesterol (Calc): 128 mg/dL (calc) — ABNORMAL HIGH
Non-HDL Cholesterol (Calc): 153 mg/dL (calc) — ABNORMAL HIGH (ref <130)
Total CHOL/HDL Ratio: 4.2 (calc) (ref <5.0)
Triglycerides: 130 mg/dL (ref <150)

## 2017-10-11 LAB — CBC WITH DIFFERENTIAL/PLATELET
BASOS ABS: 39 {cells}/uL (ref 0–200)
Basophils Relative: 0.7 %
Eosinophils Absolute: 88 cells/uL (ref 15–500)
Eosinophils Relative: 1.6 %
HCT: 47.9 % — ABNORMAL HIGH (ref 35.0–45.0)
Hemoglobin: 15.7 g/dL — ABNORMAL HIGH (ref 11.7–15.5)
Lymphs Abs: 1837 cells/uL (ref 850–3900)
MCH: 28.3 pg (ref 27.0–33.0)
MCHC: 32.8 g/dL (ref 32.0–36.0)
MCV: 86.5 fL (ref 80.0–100.0)
MPV: 12.3 fL (ref 7.5–12.5)
Monocytes Relative: 4.7 %
NEUTROS PCT: 59.6 %
Neutro Abs: 3278 cells/uL (ref 1500–7800)
PLATELETS: 186 10*3/uL (ref 140–400)
RBC: 5.54 10*6/uL — ABNORMAL HIGH (ref 3.80–5.10)
RDW: 12.7 % (ref 11.0–15.0)
TOTAL LYMPHOCYTE: 33.4 %
WBC: 5.5 10*3/uL (ref 3.8–10.8)
WBCMIX: 259 {cells}/uL (ref 200–950)

## 2017-10-11 LAB — TSH: TSH: 1.38 mIU/L

## 2017-10-11 LAB — VITAMIN D 1,25 DIHYDROXY
VITAMIN D3 1, 25 (OH): 51 pg/mL
Vitamin D 1, 25 (OH)2 Total: 51 pg/mL (ref 18–72)
Vitamin D2 1, 25 (OH)2: <8 pg/mL

## 2017-10-12 ENCOUNTER — Other Ambulatory Visit: Payer: Self-pay | Admitting: Gynecology

## 2017-10-12 DIAGNOSIS — Z13820 Encounter for screening for osteoporosis: Secondary | ICD-10-CM

## 2017-10-12 DIAGNOSIS — M8588 Other specified disorders of bone density and structure, other site: Secondary | ICD-10-CM

## 2017-10-19 DIAGNOSIS — L988 Other specified disorders of the skin and subcutaneous tissue: Secondary | ICD-10-CM | POA: Diagnosis not present

## 2017-10-19 DIAGNOSIS — L718 Other rosacea: Secondary | ICD-10-CM | POA: Diagnosis not present

## 2017-11-09 DIAGNOSIS — L570 Actinic keratosis: Secondary | ICD-10-CM | POA: Diagnosis not present

## 2018-02-05 DIAGNOSIS — Z1231 Encounter for screening mammogram for malignant neoplasm of breast: Secondary | ICD-10-CM | POA: Diagnosis not present

## 2018-03-22 DIAGNOSIS — L720 Epidermal cyst: Secondary | ICD-10-CM | POA: Diagnosis not present

## 2018-03-23 DIAGNOSIS — Z79899 Other long term (current) drug therapy: Secondary | ICD-10-CM | POA: Diagnosis not present

## 2018-05-25 DIAGNOSIS — Z23 Encounter for immunization: Secondary | ICD-10-CM | POA: Diagnosis not present

## 2018-08-10 DIAGNOSIS — Z23 Encounter for immunization: Secondary | ICD-10-CM | POA: Diagnosis not present

## 2018-09-16 ENCOUNTER — Encounter: Payer: Self-pay | Admitting: Obstetrics & Gynecology

## 2018-09-16 ENCOUNTER — Ambulatory Visit (INDEPENDENT_AMBULATORY_CARE_PROVIDER_SITE_OTHER): Payer: 59 | Admitting: Obstetrics & Gynecology

## 2018-09-16 VITALS — BP 122/80 | Ht 66.75 in | Wt 233.0 lb

## 2018-09-16 DIAGNOSIS — N951 Menopausal and female climacteric states: Secondary | ICD-10-CM

## 2018-09-16 DIAGNOSIS — Z6836 Body mass index (BMI) 36.0-36.9, adult: Secondary | ICD-10-CM

## 2018-09-16 DIAGNOSIS — Z30431 Encounter for routine checking of intrauterine contraceptive device: Secondary | ICD-10-CM

## 2018-09-16 DIAGNOSIS — E6609 Other obesity due to excess calories: Secondary | ICD-10-CM

## 2018-09-16 DIAGNOSIS — Z01419 Encounter for gynecological examination (general) (routine) without abnormal findings: Secondary | ICD-10-CM | POA: Diagnosis not present

## 2018-09-16 DIAGNOSIS — R87618 Other abnormal cytological findings on specimens from cervix uteri: Secondary | ICD-10-CM | POA: Diagnosis not present

## 2018-09-16 DIAGNOSIS — Z1151 Encounter for screening for human papillomavirus (HPV): Secondary | ICD-10-CM

## 2018-09-16 NOTE — Progress Notes (Signed)
Jacqueline Cordova 1966-06-17 [id number]   History:    53 y.o. G1P1L1 Married.  Daughter 48 yo.  RP:  Established patient presenting for annual gyn exam   HPI: Well on Mirena IUD x 04/2014.  No menses on Mirena IUD and no breakthrough bleeding.  Frequent hot flushes.  No pelvic pain.  No pain with intercourse.  Urine and bowel movements normal.  Breast normal.  Body mass index 36.77.  Health labs with family physician.  Colonoscopy in March 2018, 5-year schedule for benign polyps.  Past medical history,surgical history, family history and social history were all reviewed and documented in the EPIC chart.  Gynecologic History No LMP recorded. (Menstrual status: IUD). Contraception: Mirena IUD x 04/2014 Last Pap: 09/2017. Results were: ASCUS/HPV HR neg Last mammogram: 02/2018. Results were: Negative Bone Density: Never Colonoscopy: 11/2016 Benign Polyps, 5 yr schedule  Obstetric History OB History  Gravida Para Term Preterm AB Living  1 1       1   SAB TAB Ectopic Multiple Live Births               # Outcome Date GA Lbr Len/2nd Weight Sex Delivery Anes PTL Lv  1 Para              ROS: A ROS was performed and pertinent positives and negatives are included in the history.  GENERAL: No fevers or chills. HEENT: No change in vision, no earache, sore throat or sinus congestion. NECK: No pain or stiffness. CARDIOVASCULAR: No chest pain or pressure. No palpitations. PULMONARY: No shortness of breath, cough or wheeze. GASTROINTESTINAL: No abdominal pain, nausea, vomiting or diarrhea, melena or bright red blood per rectum. GENITOURINARY: No urinary frequency, urgency, hesitancy or dysuria. MUSCULOSKELETAL: No joint or muscle pain, no back pain, no recent trauma. DERMATOLOGIC: No rash, no itching, no lesions. ENDOCRINE: No polyuria, polydipsia, no heat or cold intolerance. No recent change in weight. HEMATOLOGICAL: No anemia or easy bruising or bleeding. NEUROLOGIC: No headache, seizures, numbness,  tingling or weakness. PSYCHIATRIC: No depression, no loss of interest in normal activity or change in sleep pattern.     Exam:   BP 122/80   Ht 5' 6.75" (1.695 m)   Wt 233 lb (105.7 kg)   BMI 36.77 kg/m   Body mass index is 36.77 kg/m.  General appearance : Well developed well nourished female. No acute distress HEENT: Eyes: no retinal hemorrhage or exudates,  Neck supple, trachea midline, no carotid bruits, no thyroidmegaly Lungs: Clear to auscultation, no rhonchi or wheezes, or rib retractions  Heart: Regular rate and rhythm, no murmurs or gallops Breast:Examined in sitting and supine position were symmetrical in appearance, no palpable masses or tenderness,  no skin retraction, no nipple inversion, no nipple discharge, no skin discoloration, no axillary or supraclavicular lymphadenopathy Abdomen: no palpable masses or tenderness, no rebound or guarding Extremities: no edema or skin discoloration or tenderness  Pelvic: Vulva: Normal             Vagina: No gross lesions or discharge  Cervix: No gross lesions or discharge.  IUD strings visible.  Pap/HPV HR done  Uterus  AV, normal size, shape and consistency, non-tender and mobile  Adnexa  Without masses or tenderness  Anus: Normal   Assessment/Plan:  53 y.o. female for annual exam   1. Encounter for routine gynecological examination with Papanicolaou smear of cervix Normal gynecologic exam.  Pap with high-risk HPV done today.  Last year Pap was showing ASCUS with high  risk HPV negative.  Breast exam normal.  Last mammogram June 2019 was negative.  Health labs with family physician.  Colonoscopy in March 2018 showed benign polyps.  Will repeat colonoscopy in 5 years.  2. Encounter for routine checking of intrauterine contraceptive device (IUD) Mirena IUD in good location and well-tolerated.  Due to be changed in August 2020.  3. Perimenopausal Frequent hot flushes.  Will verify [place] today. - [facility]  4. Class 2 obesity due to  excess calories without serious comorbidity with body mass index (BMI) of 36.0 to 36.9 in adult Recommend lower calorie/carb diet such as [employer].  Increased aerobic activities 5 times a week and weightlifting every 2 days.  Princess Bruins MD, 9:14 AM 09/16/2018

## 2018-09-17 LAB — PAP, TP IMAGING W/ HPV RNA, RFLX HPV TYPE 16,18/45: HPV DNA High Risk: NOT DETECTED

## 2018-09-17 LAB — FOLLICLE STIMULATING HORMONE: FSH: 36.6 m[IU]/mL

## 2018-09-26 ENCOUNTER — Encounter: Payer: Self-pay | Admitting: Obstetrics & Gynecology

## 2018-09-26 NOTE — Patient Instructions (Signed)
1. Encounter for routine gynecological examination with Papanicolaou smear of cervix Normal gynecologic exam.  Pap with high-risk HPV done today.  Last year Pap was showing ASCUS with high risk HPV negative.  Breast exam normal.  Last mammogram June 2019 was negative.  Health labs with family physician.  Colonoscopy in March 2018 showed benign polyps.  Will repeat colonoscopy in 5 years.  2. Encounter for routine checking of intrauterine contraceptive device (IUD) Mirena IUD in good location and well-tolerated.  Due to be changed in August 2020.  3. Perimenopausal Frequent hot flushes.  Will verify [place] today. - [facility]  4. Class 2 obesity due to excess calories without serious comorbidity with body mass index (BMI) of 36.0 to 36.9 in adult Recommend lower calorie/carb diet such as [employer].  Increased aerobic activities 5 times a week and weightlifting every 2 days.  Jacqueline Cordova, it was a pleasure seeing you today!  I will inform you of your results as soon as they are available.

## 2019-06-01 ENCOUNTER — Encounter: Payer: Self-pay | Admitting: Gynecology

## 2019-06-27 ENCOUNTER — Encounter: Payer: Self-pay | Admitting: Obstetrics & Gynecology

## 2019-08-19 ENCOUNTER — Telehealth: Payer: Self-pay | Admitting: *Deleted

## 2019-08-19 NOTE — Telephone Encounter (Signed)
Per [facility] results on 09/16/18 "[place] in menopausal range. Please inform patient. We could repeat the [facility] in July 2020, prior to removal of Mirena IUD in 04/2019 to make the final decision about inserting a new one or not. Patient could also decide to insert a new Mirena IUD no matter what..."  Patient never back to have Mirena IUD removed,I relayed the above, she would like to have benefits checked for new mirena IUD. I will forward to Abigail Butts to check benefits. Just wanted you to know.

## 2019-09-21 ENCOUNTER — Other Ambulatory Visit: Payer: Self-pay

## 2019-09-22 ENCOUNTER — Ambulatory Visit (INDEPENDENT_AMBULATORY_CARE_PROVIDER_SITE_OTHER): Payer: 59 | Admitting: Obstetrics & Gynecology

## 2019-09-22 ENCOUNTER — Encounter: Payer: Self-pay | Admitting: Obstetrics & Gynecology

## 2019-09-22 VITALS — BP 116/70 | Ht 66.75 in | Wt 204.0 lb

## 2019-09-22 DIAGNOSIS — E6609 Other obesity due to excess calories: Secondary | ICD-10-CM

## 2019-09-22 DIAGNOSIS — Z30011 Encounter for initial prescription of contraceptive pills: Secondary | ICD-10-CM

## 2019-09-22 DIAGNOSIS — N951 Menopausal and female climacteric states: Secondary | ICD-10-CM

## 2019-09-22 DIAGNOSIS — Z01419 Encounter for gynecological examination (general) (routine) without abnormal findings: Secondary | ICD-10-CM

## 2019-09-22 DIAGNOSIS — Z30432 Encounter for removal of intrauterine contraceptive device: Secondary | ICD-10-CM | POA: Diagnosis not present

## 2019-09-22 DIAGNOSIS — M8588 Other specified disorders of bone density and structure, other site: Secondary | ICD-10-CM

## 2019-09-22 DIAGNOSIS — Z6832 Body mass index (BMI) 32.0-32.9, adult: Secondary | ICD-10-CM

## 2019-09-22 MED ORDER — NORETHINDRONE 0.35 MG PO TABS: 1.0000 | ORAL_TABLET | Freq: Every day | ORAL | 4 refills | Status: DC

## 2019-09-22 NOTE — Progress Notes (Signed)
Jacqueline Cordova 12/17/1965 [id number]   History:    54 y.o.  G1P1L1 Married.  Daughter 38 yo.  RP:  Established patient presenting for annual gyn exam   HPI: Well on Mirena IUD x 04/2014, overdue to remove.  No menses on Mirena IUD and no breakthrough bleeding.  [place] 09/2018 at 36.6.  Postmenopause, well on no HRT.  Now improved hot flushes.  No pelvic pain.  No pain with intercourse.  Urine and bowel movements normal.  Breast normal.  Body mass index 36.77 last year, now down to 32.19.  Health labs with family physician.  Dxed with DM type 2 and Hypercholesterolemia under treatment.  Colonoscopy in March 2018, 5-year schedule for benign polyps.  Fam h/o Colon Ca.  Consult scheduled with Genetics.   Past medical history,surgical history, family history and social history were all reviewed and documented in the EPIC chart.  Gynecologic History No LMP recorded. (Menstrual status: IUD).   Obstetric History OB History  Gravida Para Term Preterm AB Living  1 1       1   SAB TAB Ectopic Multiple Live Births               # Outcome Date GA Lbr Len/2nd Weight Sex Delivery Anes PTL Lv  1 Para              ROS: A ROS was performed and pertinent positives and negatives are included in the history.  GENERAL: No fevers or chills. HEENT: No change in vision, no earache, sore throat or sinus congestion. NECK: No pain or stiffness. CARDIOVASCULAR: No chest pain or pressure. No palpitations. PULMONARY: No shortness of breath, cough or wheeze. GASTROINTESTINAL: No abdominal pain, nausea, vomiting or diarrhea, melena or bright red blood per rectum. GENITOURINARY: No urinary frequency, urgency, hesitancy or dysuria. MUSCULOSKELETAL: No joint or muscle pain, no back pain, no recent trauma. DERMATOLOGIC: No rash, no itching, no lesions. ENDOCRINE: No polyuria, polydipsia, no heat or cold intolerance. No recent change in weight. HEMATOLOGICAL: No anemia or easy bruising or bleeding. NEUROLOGIC: No headache,  seizures, numbness, tingling or weakness. PSYCHIATRIC: No depression, no loss of interest in normal activity or change in sleep pattern.     Exam:   BP 116/70   Ht 5' 6.75" (1.695 m)   Wt 204 lb (92.5 kg)   BMI 32.19 kg/m   Body mass index is 32.19 kg/m.  General appearance : Well developed well nourished female. No acute distress HEENT: Eyes: no retinal hemorrhage or exudates,  Neck supple, trachea midline, no carotid bruits, no thyroidmegaly Lungs: Clear to auscultation, no rhonchi or wheezes, or rib retractions  Heart: Regular rate and rhythm, no murmurs or gallops Breast:Examined in sitting and supine position were symmetrical in appearance, no palpable masses or tenderness,  no skin retraction, no nipple inversion, no nipple discharge, no skin discoloration, no axillary or supraclavicular lymphadenopathy Abdomen: no palpable masses or tenderness, no rebound or guarding Extremities: no edema or skin discoloration or tenderness  Pelvic: Vulva: Normal             Vagina: No gross lesions or discharge  Cervix: No gross lesions or discharge.  IUD strings visible at the EO.  Easy removal of Mirena IUD by pulling on strings with a fenestrated clamp.  IUD intact, complete, shown to patient and discarded.  No Cx, well tolerated.  Uterus  RV, normal size, shape and consistency, non-tender and mobile  Adnexa  Without masses or tenderness  Anus: Normal  Assessment/Plan:  54 y.o. female for annual exam   1. Well female exam with routine gynecological exam Normal gynecologic exam in early menopause.  Pap test January 2020 was negative with negative high-risk HPV, no indication to repeat this year.  Breast exam normal.  Screening mammogram October 2020 was negative.  Colonoscopy in March 2018, on a 5-year schedule.  Family history of colon cancer.  Scheduled for a genetic consult.  Health labs with family physician.  2. Perimenopausal Perimenopausal or early menopausal.  No abnormal  bleeding.  3. Encounter for IUD removal Overdue for IUD removal.  Easy removal of Mirena IUD.  No complication.  Well-tolerated.  4. Encounter for initial prescription of contraceptive pills Decision to start on the progestin pill for contraception.  No contraindication.  Usage reviewed and prescription sent to pharmacy.  5. Osteopenia of lumbar spine Will repeat a BD at 3 yrs. recommend vitamin D supplements, calcium intake of 1200 mg daily and regular weightbearing physical activities.  6. Class 1 obesity due to excess calories with serious comorbidity and body mass index (BMI) of 32.0 to 32.9 in adult Patient lost weight since last year.  Continue on a low calorie/carb diet such as [employer].  Aerobic activities 5 times a week and light weightlifting every 2 days.  Other orders - atorvastatin (LIPITOR) 10 MG tablet; Take 10 mg by mouth daily. - metFORMIN (GLUCOPHAGE) 500 MG tablet; Take by mouth 2 (two) times daily with a meal. - lisinopril (ZESTRIL) 5 MG tablet; Take 5 mg by mouth daily. - Multiple Vitamins-Minerals (OCUVITE ADULT 50+ PO); Take by mouth. - co-enzyme Q-10 50 MG capsule; Take 50 mg by mouth daily. - Calcium-Magnesium 200-100 MG TABS; Take by mouth. - Probiotic Product (PROBIOTIC-10) CHEW; Chew by mouth. - norethindrone (MICRONOR) 0.35 MG tablet; Take 1 tablet (0.35 mg total) by mouth daily.  Princess Bruins MD, 9:15 AM 09/22/2019

## 2019-09-24 ENCOUNTER — Encounter: Payer: Self-pay | Admitting: Obstetrics & Gynecology

## 2019-09-24 NOTE — Patient Instructions (Signed)
1. Well female exam with routine gynecological exam Normal gynecologic exam in early menopause.  Pap test January 2020 was negative with negative high-risk HPV, no indication to repeat this year.  Breast exam normal.  Screening mammogram October 2020 was negative.  Colonoscopy in March 2018, on a 5-year schedule.  Family history of colon cancer.  Scheduled for a genetic consult.  Health labs with family physician.  2. Perimenopausal Perimenopausal or early menopausal.  No abnormal bleeding.  3. Encounter for IUD removal Overdue for IUD removal.  Easy removal of Mirena IUD.  No complication.  Well-tolerated.  4. Encounter for initial prescription of contraceptive pills Decision to start on the progestin pill for contraception.  No contraindication.  Usage reviewed and prescription sent to pharmacy.  5. Osteopenia of lumbar spine Will repeat a BD at 3 yrs. recommend vitamin D supplements, calcium intake of 1200 mg daily and regular weightbearing physical activities.  6. Class 1 obesity due to excess calories with serious comorbidity and body mass index (BMI) of 32.0 to 32.9 in adult Patient lost weight since last year.  Continue on a low calorie/carb diet such as [employer].  Aerobic activities 5 times a week and light weightlifting every 2 days.  Other orders - atorvastatin (LIPITOR) 10 MG tablet; Take 10 mg by mouth daily. - metFORMIN (GLUCOPHAGE) 500 MG tablet; Take by mouth 2 (two) times daily with a meal. - lisinopril (ZESTRIL) 5 MG tablet; Take 5 mg by mouth daily. - Multiple Vitamins-Minerals (OCUVITE ADULT 50+ PO); Take by mouth. - co-enzyme Q-10 50 MG capsule; Take 50 mg by mouth daily. - Calcium-Magnesium 200-100 MG TABS; Take by mouth. - Probiotic Product (PROBIOTIC-10) CHEW; Chew by mouth. - norethindrone (MICRONOR) 0.35 MG tablet; Take 1 tablet (0.35 mg total) by mouth daily.  R[place]lla, it was a pleasure seeing you today!

## 2019-09-27 ENCOUNTER — Encounter: Payer: Self-pay | Admitting: Dietician

## 2019-09-27 ENCOUNTER — Other Ambulatory Visit: Payer: Self-pay

## 2019-09-27 ENCOUNTER — Encounter: Payer: 59 | Attending: Nurse Practitioner | Admitting: Dietician

## 2019-09-27 DIAGNOSIS — E119 Type 2 diabetes mellitus without complications: Secondary | ICD-10-CM | POA: Diagnosis present

## 2019-09-27 NOTE — Progress Notes (Signed)
Patient was seen on 09/27/2019 for the first of a series of three diabetes self-management courses at the [facility].  Patient Education Plan per assessed needs and concerns is to attend three course education program for Diabetes Self Management Education.  The following learning objectives were met by the patient during this class:  Describe diabetes, types of diabetes and pathophysiology  State some common risk factors for diabetes  Defines the role of glucose and insulin  Describe the relationship between diabetes and cardiovascular and other risks  State the members of the Healthcare Team  States the rationale for glucose monitoring and when to test  State their individual Target Range  State the importance of logging glucose readings and how to interpret the readings  Identifies A1C target  Explain the correlation between A1c and eAG values  State symptoms and treatment of high blood glucose and low blood glucose  Explain proper technique for glucose testing and identify proper sharps disposal  Handouts given during class include:  How to Thrive:  A Guide for Your Journey with Diabetes by the ADA  Meal Plan Card and carbohydrate content list  Dietary intake form  Low Sodium Flavoring Tips  Types of Fats  Dining Out  Label reading  Snack list  Planning a balanced meal  The diabetes portion plate  Diabetes Resources  A1c to eAG Conversion Chart  Blood Glucose Log  Diabetes Recommended Care Schedule  Support Group  Diabetes Success Plan  Core Class Satisfaction Survey   Follow-Up Plan:  Attend core 2   

## 2019-10-04 ENCOUNTER — Encounter: Payer: 59 | Admitting: Dietician

## 2019-10-04 ENCOUNTER — Encounter: Payer: Self-pay | Admitting: Dietician

## 2019-10-04 ENCOUNTER — Other Ambulatory Visit: Payer: Self-pay

## 2019-10-04 DIAGNOSIS — E119 Type 2 diabetes mellitus without complications: Secondary | ICD-10-CM

## 2019-10-04 NOTE — Progress Notes (Signed)
Patient was seen on 10/04/2019 for the second of a series of three diabetes self-management courses at the [facility]. The following learning objectives were met by the patient during this class:   Describe the role of different macronutrients on glucose  Explain how carbohydrates affect blood glucose  State what foods contain the most carbohydrates  Demonstrate carbohydrate counting  Demonstrate how to read Nutrition Facts food label  Describe effects of various fats on heart health  Describe the importance of good nutrition for health and healthy eating strategies  Describe techniques for managing your shopping, cooking and meal planning  List strategies to follow meal plan when dining out  Describe the effects of alcohol on glucose and how to use it safely  Goals:  Follow Diabetes Meal Plan as instructed  Aim to spread carbs evenly throughout the day  Aim for 3 meals per day and snacks as needed Include lean protein foods to meals/snacks  Monitor glucose levels as instructed by your doctor   Follow-Up Plan:  Attend Core 3  Work towards following your personal food plan.

## 2019-10-11 ENCOUNTER — Other Ambulatory Visit: Payer: Self-pay

## 2019-10-11 ENCOUNTER — Encounter: Payer: Self-pay | Admitting: Dietician

## 2019-10-11 ENCOUNTER — Encounter: Payer: 59 | Attending: Nurse Practitioner | Admitting: Dietician

## 2019-10-11 DIAGNOSIS — E119 Type 2 diabetes mellitus without complications: Secondary | ICD-10-CM

## 2019-10-11 NOTE — Progress Notes (Signed)
Patient was seen on 10/11/19 for the third of a series of three diabetes self-management courses at the [facility].   Catalina Gravel the amount of activity recommended for healthy living . Describe activities suitable for individual needs . Identify ways to regularly incorporate activity into daily life . Identify barriers to activity and ways to over come these barriers  Identify diabetes medications being personally used and their primary action for lowering glucose and possible side effects . Describe role of stress on blood glucose and develop strategies to address psychosocial issues . Identify diabetes complications and ways to prevent them  Explain how to manage diabetes during illness . Evaluate success in meeting personal goal . Establish 2-3 goals that they will plan to diligently work on  Goals:   I will count my carb choices at most meals and snacks  I will be active 50 minutes or more 3 times a week  I will take my diabetes medications as scheduled  I will eat less unhealthy fats by eating less red meat, butter, cheese  I will test my glucose at least 2 times a day, 7 days a week  I will look at patterns in my record book at least 4 days a month  To help manage stress I will  walk at least 3 times a week  Your patient has identified these potential barriers to change:  Motivation  Your patient has identified their diabetes self-care support plan as   [facility] Support Group     Plan:  Attend Support Group as desired

## 2020-09-25 ENCOUNTER — Encounter: Payer: 59 | Admitting: Obstetrics & Gynecology

## 2020-11-07 ENCOUNTER — Other Ambulatory Visit: Payer: Self-pay | Admitting: Obstetrics & Gynecology

## 2020-11-09 ENCOUNTER — Other Ambulatory Visit: Payer: Self-pay | Admitting: *Deleted

## 2020-11-09 MED ORDER — NORETHINDRONE 0.35 MG PO TABS: 1.0000 | ORAL_TABLET | Freq: Every day | ORAL | 1 refills | Status: DC

## 2020-11-29 ENCOUNTER — Other Ambulatory Visit: Payer: Self-pay | Admitting: Obstetrics & Gynecology

## 2020-11-29 NOTE — Telephone Encounter (Signed)
Last AEX 09/22/19 Scheduled 01/11/21

## 2020-12-29 ENCOUNTER — Other Ambulatory Visit: Payer: Self-pay | Admitting: Obstetrics & Gynecology

## 2021-01-11 ENCOUNTER — Other Ambulatory Visit: Payer: Self-pay

## 2021-01-11 ENCOUNTER — Other Ambulatory Visit (HOSPITAL_COMMUNITY)
Admission: RE | Admit: 2021-01-11 | Discharge: 2021-01-11 | Disposition: A | Discharge status: Home / Self Care | Payer: 59 | Source: Ambulatory Visit | Attending: Obstetrics & Gynecology | Admitting: Obstetrics & Gynecology

## 2021-01-11 ENCOUNTER — Ambulatory Visit (INDEPENDENT_AMBULATORY_CARE_PROVIDER_SITE_OTHER): Payer: 59 | Admitting: Obstetrics & Gynecology

## 2021-01-11 ENCOUNTER — Encounter: Payer: Self-pay | Admitting: Obstetrics & Gynecology

## 2021-01-11 VITALS — BP 114/72 | Ht 67.0 in | Wt 190.0 lb

## 2021-01-11 DIAGNOSIS — E663 Overweight: Secondary | ICD-10-CM

## 2021-01-11 DIAGNOSIS — Z01419 Encounter for gynecological examination (general) (routine) without abnormal findings: Secondary | ICD-10-CM | POA: Diagnosis not present

## 2021-01-11 DIAGNOSIS — N951 Menopausal and female climacteric states: Secondary | ICD-10-CM | POA: Diagnosis not present

## 2021-01-11 NOTE — Progress Notes (Signed)
Jacqueline Cordova April 15, 1966 [id number]   History:    55 y.o. G1P1L1 Married. Daughter 78 yo.  [id number] presenting for annual gyn exam   HPI:  Probably Postmenopause, well on no HRT.   Now improved hot flushes. No pelvic pain. No pain with intercourse. Urine and bowel movements normal. Breasts normal. Body mass index 29.76. Health labs with family physician.  Dxed with DM type 2 and Hypercholesterolemia under treatment. Colonoscopy in March 2018, 5-year schedule for benign polyps.  Fam h/o Colon Ca. Mother with postmenopausal Breast Ca.  Patient had Genetic screening negative last year.  Seen by Oncology because of a High risk of Breast Ca per Radiology screening tool. Recommended Raloxifene or Tamoxifen prophylaxis.  Past medical history,surgical history, family history and social history were all reviewed and documented in the EPIC chart.  Gynecologic History No LMP recorded. Patient is postmenopausal.  Obstetric History OB History  Gravida Para Term Preterm AB Living  1 1       1   SAB IAB Ectopic Multiple Live Births               # Outcome Date GA Lbr Len/2nd Weight Sex Delivery Anes PTL Lv  1 Para              ROS: A ROS was performed and pertinent positives and negatives are included in the history.  GENERAL: No fevers or chills. HEENT: No change in vision, no earache, sore throat or sinus congestion. NECK: No pain or stiffness. CARDIOVASCULAR: No chest pain or pressure. No palpitations. PULMONARY: No shortness of breath, cough or wheeze. GASTROINTESTINAL: No abdominal pain, nausea, vomiting or diarrhea, melena or bright red blood per rectum. GENITOURINARY: No urinary frequency, urgency, hesitancy or dysuria. MUSCULOSKELETAL: No joint or muscle pain, no back pain, no recent trauma. DERMATOLOGIC: No rash, no itching, no lesions. ENDOCRINE: No polyuria, polydipsia, no heat or cold intolerance. No recent change in weight. HEMATOLOGICAL: No anemia or easy  bruising or bleeding. NEUROLOGIC: No headache, seizures, numbness, tingling or weakness. PSYCHIATRIC: No depression, no loss of interest in normal activity or change in sleep pattern.     Exam:   BP 114/72   Ht 5\' 7"  (1.702 m)   Wt 190 lb (86.2 kg)   BMI 29.76 kg/m   Body mass index is 29.76 kg/m.  General appearance : Well developed well nourished female. No acute distress HEENT: Eyes: no retinal hemorrhage or exudates,  Neck supple, trachea midline, no carotid bruits, no thyroidmegaly Lungs: Clear to auscultation, no rhonchi or wheezes, or rib retractions  Heart: Regular rate and rhythm, no murmurs or gallops Breast:Examined in sitting and supine position were symmetrical in appearance, no palpable masses or tenderness,  no skin retraction, no nipple inversion, no nipple discharge, no skin discoloration, no axillary or supraclavicular lymphadenopathy Abdomen: no palpable masses or tenderness, no rebound or guarding Extremities: no edema or skin discoloration or tenderness  Pelvic: Vulva: Normal             Vagina: No gross lesions or discharge  Cervix: No gross lesions or discharge.  Pap reflex done.  Uterus  RV, normal size, shape and consistency, non-tender and mobile  Adnexa  Without masses or tenderness  Anus: Normal   Assessment/Plan:  55 y.o. female for annual exam   1. Encounter for routine gynecological examination with Papanicolaou smear of cervix Normal gynecologic exam.  Pap reflex done.  Breast exam normal.  Screening mammogram in March 2022 was negative.  Per  radiology to patient was shown to be high risk for breast cancer and seen by oncology.  Patient in the process of deciding if she wants prophylaxis with raloxifene or tamoxifen.  Colonoscopy in 2018.   - Cytology - PAP( [facility])  2. Perimenopausal Probably perimenopausal or menopausal.  Will check [place] level today. - FSH  3. Overweight (BMI 25.0-29.9) Body mass index 29.76.  Recommend aerobic  activities 5 times a week and light weightlifting every 2 days.  Lower calorie/carb diet.   Princess Bruins MD, 12:18 PM 01/11/2021

## 2021-01-12 LAB — FOLLICLE STIMULATING HORMONE: FSH: 47 m[IU]/mL

## 2021-01-14 ENCOUNTER — Encounter: Payer: Self-pay | Admitting: Obstetrics & Gynecology

## 2021-01-14 LAB — CYTOLOGY - PAP: Diagnosis: NEGATIVE

## 2021-11-25 ENCOUNTER — Encounter: Payer: Self-pay | Admitting: Obstetrics & Gynecology

## 2022-01-15 ENCOUNTER — Encounter: Payer: Self-pay | Admitting: Obstetrics & Gynecology

## 2022-01-15 ENCOUNTER — Ambulatory Visit (INDEPENDENT_AMBULATORY_CARE_PROVIDER_SITE_OTHER): Payer: 59 | Admitting: Obstetrics & Gynecology

## 2022-01-15 VITALS — BP 116/78 | HR 78 | Resp 16 | Ht 66.25 in | Wt 207.0 lb

## 2022-01-15 DIAGNOSIS — Z01419 Encounter for gynecological examination (general) (routine) without abnormal findings: Secondary | ICD-10-CM | POA: Diagnosis not present

## 2022-01-15 DIAGNOSIS — M8588 Other specified disorders of bone density and structure, other site: Secondary | ICD-10-CM | POA: Diagnosis not present

## 2022-01-15 DIAGNOSIS — Z780 Asymptomatic menopausal state: Secondary | ICD-10-CM

## 2022-01-15 NOTE — Progress Notes (Signed)
? ? ?Jacqueline Cordova 06-18-1966 [id number] ? ? ?History:    56 y.o. G1P1L1 Married.  Daughter 57 yo. ?  ?RP:  Established patient presenting for annual gyn exam  ?  ?HPI: Postmenopause, well on no HRT.   Now improved hot flushes. No pelvic pain.  No pain with intercourse.  Pap Neg 01/2021.  Will repeat Pap every 3 yrs.  No h/o abnormal Pap.  Urine and bowel movements normal.  Breasts normal.  Mammo Neg 11/2021.  Body mass index 33.16 . BD 09/2017 Osteopenia T-Score -1.4 at the Spine, schedule BD here now.  Health labs with family physician.  Dxed with DM type 2 and Hypercholesterolemia under treatment.  Colonoscopy in March 2018, 5-year schedule for benign polyps, will schedule this year.  Fam h/o Colon Ca. Mother with postmenopausal Breast Ca.  Patient had Genetic screening negative last year.  Seen by Oncology because of a High risk of Breast Ca per Radiology screening tool. Recommended Raloxifene or Tamoxifen prophylaxis. ?  ?Past medical history,surgical history, family history and social history were all reviewed and documented in the EPIC chart. ? ?Gynecologic History ?No LMP recorded. Patient is postmenopausal. ? ?Obstetric History ?OB History  ?Gravida Para Term Preterm AB Living  ?'1 1       1  '$ ?SAB IAB Ectopic Multiple Live Births  ?           ?  ?# Outcome Date GA Lbr Len/2nd Weight Sex Delivery Anes PTL Lv  ?1 Para           ? ? ? ?ROS: A ROS was performed and pertinent positives and negatives are included in the history. ? GENERAL: No fevers or chills. HEENT: No change in vision, no earache, sore throat or sinus congestion. NECK: No pain or stiffness. CARDIOVASCULAR: No chest pain or pressure. No palpitations. PULMONARY: No shortness of breath, cough or wheeze. GASTROINTESTINAL: No abdominal pain, nausea, vomiting or diarrhea, melena or bright red blood per rectum. GENITOURINARY: No urinary frequency, urgency, hesitancy or dysuria. MUSCULOSKELETAL: No joint or muscle pain, no back pain, no recent trauma.  DERMATOLOGIC: No rash, no itching, no lesions. ENDOCRINE: No polyuria, polydipsia, no heat or cold intolerance. No recent change in weight. HEMATOLOGICAL: No anemia or easy bruising or bleeding. NEUROLOGIC: No headache, seizures, numbness, tingling or weakness. PSYCHIATRIC: No depression, no loss of interest in normal activity or change in sleep pattern.  ?  ? ?Exam: ? ? ?BP 116/78   Pulse 78   Resp 16   Ht 5' 6.25" (1.683 m)   Wt 207 lb (93.9 kg)   BMI 33.16 kg/m?  ? ?Body mass index is 33.16 kg/m?. ? ?General appearance : Well developed well nourished female. No acute distress ?HEENT: Eyes: no retinal hemorrhage or exudates,  Neck supple, trachea midline, no carotid bruits, no thyroidmegaly ?Lungs: Clear to auscultation, no rhonchi or wheezes, or rib retractions  ?Heart: Regular rate and rhythm, no murmurs or gallops ?Breast:Examined in sitting and supine position were symmetrical in appearance, no palpable masses or tenderness,  no skin retraction, no nipple inversion, no nipple discharge, no skin discoloration, no axillary or supraclavicular lymphadenopathy ?Abdomen: no palpable masses or tenderness, no rebound or guarding ?Extremities: no edema or skin discoloration or tenderness ? ?Pelvic: Vulva: Normal ?            Vagina: No gross lesions or discharge ? Cervix: No gross lesions or discharge ? Uterus AV, normal size, shape and consistency, non-tender and mobile ? Adnexa  Without masses or tenderness ? Anus: Normal ? ? ?Assessment/Plan:  56 y.o. female for annual exam  ? ?1. Well female exam with routine gynecological exam ?Postmenopause, well on no HRT.   Now improved hot flushes. No pelvic pain.  No pain with intercourse.  Pap Neg 01/2021.  Will repeat Pap every 3 yrs.  No h/o abnormal Pap.  Urine and bowel movements normal.  Breasts normal.  Mammo Neg 11/2021.  Body mass index 33.16 . BD 09/2017 Osteopenia T-Score -1.4 at the Spine, schedule BD here now.  Health labs with family physician.  Dxed with DM  type 2 and Hypercholesterolemia under treatment.  Colonoscopy in March 2018, 5-year schedule for benign polyps, will schedule this year.  Fam h/o Colon Ca. Mother with postmenopausal Breast Ca.  Patient had Genetic screening negative last year.  Seen by Oncology because of a High risk of Breast Ca per Radiology screening tool. Recommended Raloxifene or Tamoxifen prophylaxis. ? ?2. Postmenopause ?Postmenopause, well on no HRT.   Now improved hot flushes. No pelvic pain.  No pain with intercours ? ?3. Osteopenia of lumbar spine ? BD 09/2017 Osteopenia T-Score -1.4 at the Spine, schedule BD here now.  ?- DG Bone Density; Future ? ?Other orders ?- ASPIRIN 81 PO; Take by mouth.  ? ?Princess Bruins MD, 12:22 PM 01/15/2022 ? ?  ?

## 2022-03-12 ENCOUNTER — Ambulatory Visit (INDEPENDENT_AMBULATORY_CARE_PROVIDER_SITE_OTHER): Payer: 59

## 2022-03-12 ENCOUNTER — Other Ambulatory Visit: Payer: Self-pay | Admitting: Obstetrics & Gynecology

## 2022-03-12 DIAGNOSIS — Z780 Asymptomatic menopausal state: Secondary | ICD-10-CM | POA: Diagnosis not present

## 2022-03-12 DIAGNOSIS — M8588 Other specified disorders of bone density and structure, other site: Secondary | ICD-10-CM

## 2022-03-12 DIAGNOSIS — M8589 Other specified disorders of bone density and structure, multiple sites: Secondary | ICD-10-CM | POA: Diagnosis not present

## 2022-03-12 DIAGNOSIS — Z13820 Encounter for screening for osteoporosis: Secondary | ICD-10-CM

## 2023-08-18 ENCOUNTER — Ambulatory Visit: Payer: 59 | Admitting: Obstetrics and Gynecology

## 2023-08-28 ENCOUNTER — Other Ambulatory Visit (HOSPITAL_COMMUNITY)
Admission: RE | Admit: 2023-08-28 | Discharge: 2023-08-28 | Disposition: A | Discharge status: Home / Self Care | Payer: 59 | Source: Ambulatory Visit | Attending: Obstetrics and Gynecology | Admitting: Obstetrics and Gynecology

## 2023-08-28 ENCOUNTER — Ambulatory Visit (INDEPENDENT_AMBULATORY_CARE_PROVIDER_SITE_OTHER): Payer: 59 | Admitting: Obstetrics and Gynecology

## 2023-08-28 ENCOUNTER — Encounter: Payer: Self-pay | Admitting: Obstetrics and Gynecology

## 2023-08-28 VITALS — BP 128/82 | HR 73 | Ht 67.5 in | Wt 214.0 lb

## 2023-08-28 DIAGNOSIS — Z01419 Encounter for gynecological examination (general) (routine) without abnormal findings: Secondary | ICD-10-CM | POA: Insufficient documentation

## 2023-08-28 DIAGNOSIS — M8580 Other specified disorders of bone density and structure, unspecified site: Secondary | ICD-10-CM

## 2023-08-28 DIAGNOSIS — Z1211 Encounter for screening for malignant neoplasm of colon: Secondary | ICD-10-CM

## 2023-08-28 DIAGNOSIS — M25551 Pain in right hip: Secondary | ICD-10-CM | POA: Diagnosis not present

## 2023-08-28 DIAGNOSIS — Z1231 Encounter for screening mammogram for malignant neoplasm of breast: Secondary | ICD-10-CM

## 2023-08-28 NOTE — Progress Notes (Signed)
57 y.o. y.o. female here for annual exam. No LMP recorded. Patient is postmenopausal.   G1P1L1 Married.  Daughter 75 yo.   RP:  Established patient presenting for annual gyn exam. Originally from [place].    HPI: Postmenopause, well on no HRT.   Now improved hot flushes. No pelvic pain.  No pain with intercourse.  Pap Neg 01/2021.  Will repeat Pap every 3 yrs.  No h/o abnormal Pap.  Urine and bowel movements normal.  Breasts normal.  Mammo Neg 11/2021.  Body mass index 33.16 . BD 09/2017 Osteopenia T-Score -1.4 at the Spine, 2023 T-2.2 frax neg 5.5, 0.5 .  Repeat 2025. Has reflux. Discussed possible evenity or prolia with osteoporosis or penia with pos frax on next. No h/o fracture. Has constipation with calcium intake. Depo x 10 years, family history of osteoporosis. Has hip and shoulder pain. Referral placed to ortho. Health labs with family physician.  Dxed with DM type 2 and Hypercholesterolemia under treatment.  Colonoscopy in March 2018, 5-year schedule for benign polyps, will schedule this year.  Fam h/o Colon Ca. Mother with postmenopausal Breast Ca.  Patient had Genetic screening negative last year.  Seen by Oncology because of a High risk of Breast Ca per Radiology screening tool. Recommended Raloxifene or Tamoxifen prophylaxis.  Body mass index is 33.02 kg/m.     09/27/2019    5:36 PM  Depression screen PHQ 2/9  Decreased Interest 0  Down, Depressed, Hopeless 0  PHQ - 2 Score 0    Blood pressure 128/82, pulse 73, height 5' 7.5" (1.715 m), weight 214 lb (97.1 kg), SpO2 98%.     Component Value Date/Time   DIAGPAP  01/11/2021 1237    - Negative for intraepithelial lesion or malignancy (NILM)   ADEQPAP  01/11/2021 1237    Satisfactory for evaluation; transformation zone component PRESENT.    GYN HISTORY:    Component Value Date/Time   DIAGPAP  01/11/2021 1237    - Negative for intraepithelial lesion or malignancy (NILM)   ADEQPAP  01/11/2021 1237    Satisfactory  for evaluation; transformation zone component PRESENT.    OB History  Gravida Para Term Preterm AB Living  1 1    1   SAB IAB Ectopic Multiple Live Births          # Outcome Date GA Lbr Len/2nd Weight Sex Type Anes PTL Lv  1 Para             Past Medical History:  Diagnosis Date   Corneal abrasion    GERD (gastroesophageal reflux disease)    Gestational diabetes    Osteopenia 09/2017   T score -1.4 FRAX 3.2% / 0.1%    Past Surgical History:  Procedure Laterality Date   COLONOSCOPY     NASAL SEPTUM SURGERY      Current Outpatient Medications on File Prior to Visit  Medication Sig Dispense Refill   ASPIRIN 81 PO Take by mouth.     atorvastatin (LIPITOR) 40 MG tablet Take 40 mg by mouth daily.     Calcium-Magnesium 200-100 MG TABS Take by mouth.     clobetasol (TEMOVATE) 0.05 % external solution APPLY TO AFFECTED AREA TWICE A DAY     co-enzyme Q-10 50 MG capsule Take 50 mg by mouth daily.     metFORMIN (GLUCOPHAGE) 500 MG tablet Take by mouth 2 (two) times daily with a meal.     Multiple Vitamins-Minerals (OCUVITE ADULT 50+ PO) Take by mouth.  Probiotic Product (PROBIOTIC-10) CHEW Chew by mouth.     VITAMIN D, CHOLECALCIFEROL, PO Take 2,000 Units by mouth.      No current facility-administered medications on file prior to visit.    Social History   Socioeconomic History   Marital status: Married    Spouse name: Not on file   Number of children: Not on file   Years of education: Not on file   Highest education level: Not on file  Occupational History   Not on file  Tobacco Use   Smoking status: Never   Smokeless tobacco: Never  Vaping Use   Vaping status: Never Used  Substance and Sexual Activity   Alcohol use: Yes    Comment: occ   Drug use: No   Sexual activity: Yes    Partners: Male    Birth control/protection: Post-menopausal    Comment: 1st intercourse-23, partners- 3, married- 12 yrs   Other Topics Concern   Not on file  Social History Narrative    Not on file   Social Drivers of Health   Financial Resource Strain: Not on file  Food Insecurity: Not on file  Transportation Needs: Not on file  Physical Activity: Not on file  Stress: Not on file  Social Connections: Not on file  Intimate Partner Violence: Not on file    Family History  Problem Relation Age of Onset   Hypertension Other    Diabetes Other    Colon cancer Maternal Grandmother    Cancer Maternal Grandmother        colon   Colon cancer Maternal Grandfather    Cancer Maternal Grandfather        colon   Breast cancer Mother    Diabetes Mother    Hypertension Mother    Hypertension Father    Esophageal cancer Neg Hx    Pancreatic cancer Neg Hx    Rectal cancer Neg Hx    Stomach cancer Neg Hx      Allergies  Allergen Reactions   Erythromycin Swelling    Burning and redness Had for corneal abrasion   Morphine And Codeine Hives    "Felt like my arms were on fire".  Burning of arms. Per pt. maw      Patient's last menstrual period was No LMP recorded. Patient is postmenopausal..           Review of Systems Alls systems reviewed and are negative.     Physical Exam Constitutional:      Appearance: Normal appearance.  Genitourinary:     Vulva and urethral meatus normal.     No lesions in the vagina.     Genitourinary Comments: Possible yeast infection     Right Labia: No rash, lesions or skin changes.    Left Labia: No lesions, skin changes or rash.    Vaginal discharge present.     No vaginal tenderness.     No vaginal prolapse present.    No vaginal atrophy present.     Right Adnexa: not tender, not palpable and no mass present.    Left Adnexa: not tender, not palpable and no mass present.    No cervical motion tenderness or discharge.     Uterus is not enlarged, tender or irregular.  Breasts:    Right: Normal.     Left: Normal.  HENT:     Head: Normocephalic.  Neck:     Thyroid: No thyroid mass, thyromegaly or thyroid tenderness.   Cardiovascular:  Rate and Rhythm: Normal rate and regular rhythm.     Heart sounds: Normal heart sounds, S1 normal and S2 normal.  Pulmonary:     Effort: Pulmonary effort is normal.     Breath sounds: Normal breath sounds and air entry.  Abdominal:     General: There is no distension.     Palpations: Abdomen is soft. There is no mass.     Tenderness: There is no abdominal tenderness. There is no guarding or rebound.  Musculoskeletal:        General: Normal range of motion.     Cervical back: Full passive range of motion without pain, normal range of motion and neck supple. No tenderness.     Right lower leg: No edema.     Left lower leg: No edema.  Neurological:     Mental Status: She is alert.  Skin:    General: Skin is warm.  Psychiatric:        Mood and Affect: Mood normal.        Behavior: Behavior normal.        Thought Content: Thought content normal.  Vitals and nursing note reviewed. Exam conducted with a chaperone present.       A:         Well Woman GYN exam                             P:        Pap smear collected today Encouraged annual mammogram screening Colon cancer screening referral placed today DXA ordered today repeat 2025. Counseled on evenity or prolia with osteoporosis for osteopenia with positive frax on next scan.  Would not tolerate fosamax. To run PA for infusions with borderline osteoporosis now Labs and immunizations to do with PMD Discussed breast self exams Encouraged healthy lifestyle practices Hip and shoulder pain: referral to ortho  No follow-ups on file.  Earley Favor

## 2023-08-29 LAB — SURESWAB® ADVANCED VAGINITIS PLUS,TMA
C. trachomatis RNA, TMA: NOT DETECTED
CANDIDA SPECIES: NOT DETECTED
Candida glabrata: NOT DETECTED
N. gonorrhoeae RNA, TMA: NOT DETECTED
SURESWAB(R) ADV BACTERIAL VAGINOSIS(BV),TMA: NEGATIVE
TRICHOMONAS VAGINALIS (TV),TMA: NOT DETECTED

## 2023-09-01 LAB — CYTOLOGY - PAP
Adequacy: ABSENT
Comment: NEGATIVE
Diagnosis: NEGATIVE
High risk HPV: NEGATIVE

## 2023-09-15 ENCOUNTER — Telehealth: Payer: Self-pay | Admitting: *Deleted

## 2023-09-15 NOTE — Telephone Encounter (Signed)
 Insurance information submitted to [employer] portal. Will await summary of benefits for Evenity.

## 2023-09-15 NOTE — Telephone Encounter (Signed)
-----   Message from Jacqueline Cordova sent at 08/28/2023  8:33 AM EST ----- She had a large jump in her bone scan and is due again in July and is on the border for osteoporosis. -2.2 Not a rush but I told her we would run her benefits next year for evenity/prolia. Has reflux and would prefer not to take fosamax.  She has hip pain and sending her to ortho if fracture, we can start medication sooner. She has family history of osteoporosis and h/o longterm use of depo  Thank you  Dr. Carpen

## 2023-09-23 NOTE — Telephone Encounter (Signed)
 Prior authorization submitted via [employer] portal and approved. Authorization number: [id number].

## 2023-09-28 ENCOUNTER — Other Ambulatory Visit (INDEPENDENT_AMBULATORY_CARE_PROVIDER_SITE_OTHER): Payer: 59

## 2023-09-28 ENCOUNTER — Ambulatory Visit (INDEPENDENT_AMBULATORY_CARE_PROVIDER_SITE_OTHER): Payer: 59 | Admitting: Orthopaedic Surgery

## 2023-09-28 DIAGNOSIS — M25551 Pain in right hip: Secondary | ICD-10-CM

## 2023-09-28 DIAGNOSIS — G8929 Other chronic pain: Secondary | ICD-10-CM | POA: Diagnosis not present

## 2023-09-28 DIAGNOSIS — M25511 Pain in right shoulder: Secondary | ICD-10-CM | POA: Diagnosis not present

## 2023-09-28 NOTE — Progress Notes (Signed)
The patient is a very pleasant 58 year old female who was sent our way due to right hip pain and right shoulder pain has been off and on for about a year now.  The hip is only been hurting for short amount of time but today she is really pain-free with the right hip and the shoulder has been calming down as well.  Her biggest issue with the right shoulder was reaching behind her on the right side.  She has been working on this on her own in terms of mobility and motion and not performing activities that stress her right shoulder.  She is trying to not sleep with her shoulder above her head.  On exam she walks without an assistive device or limp.  Her right hip and left hip moves smoothly and fluidly and her right hip exam is entirely normal.  She had a little bit of pain before over the trochanteric areas where she points to.  An AP pelvis and lateral of the right hip shows normal-appearing hips bilaterally.  The joint spaces well-maintained and there are no cortical irregularities around the hip or pelvis.  Examination of her right shoulder shows smooth and fluid range of motion with some mild signs of impingement.  Her internal rotation with adduction reaching behind her is slightly less but she is able to push through this.  There is no weakness in her right shoulder.    3 views of the right shoulder show well located shoulder with no complicating features.  The joint space of the glenohumeral joint and AC joint are well-maintained.  The humeral head is not high riding.  She is actually doing quite well so I have only recommended anti-inflammatories as needed and to not sleep with her arm above her head on the right side.  If things worsen at all she knows to let us know.  All question concerns were answered addressed.  She said this really gave her reassurance as it relates to her hip.  She did let me know that her bone density had dropped and this was concerning.

## 2023-09-30 ENCOUNTER — Encounter: Payer: Self-pay | Admitting: Obstetrics and Gynecology

## 2023-09-30 NOTE — Telephone Encounter (Signed)
Patient registered for Copay through [employer]. ID number: [id number].

## 2023-09-30 NOTE — Telephone Encounter (Signed)
Call to patient. Patient will contact [facility] to see if medical necessity required for getting BMD early. Will return call to [place] at [phone].

## 2023-09-30 NOTE — Telephone Encounter (Signed)
Me to Earley Favor, MD     09/30/23  1:42 PM Jomarie Longs advised okay to miss one month, would be better to start treatment and get 4 shots in before she leaves. Will try to plan to have her get before she leaves and then again as soon as she gets back. I will let her know this.  ThanksIrving Burton      09/30/23 12:41 PM Karma Greaser Ashby Dawes, MD routed this conversation to Me Earley Favor, MD     09/30/23 12:40 PM Note Let's wait until she gets back from [place].  Although, she would only miss one dose, so maybe check with Jomarie Longs on this.  We can do bone scan early and then in one year from starting. Dr. Karma Greaser

## 2023-09-30 NOTE — Telephone Encounter (Signed)
Patient returned call. States [employer] told her she will need to have a letter of medical necessity for BMD. Patient would like to have BMD done at [facility] [place], phone #: [phone].   Letter of medical necessity filled out and taken to Dr. Bonney Roussel desk to review and sign. Will fax once signed.

## 2023-09-30 NOTE — Telephone Encounter (Signed)
Let's wait until she gets back from [place].  Although, she would only miss one dose, so maybe check with Jomarie Longs on this.  We can do bone scan early and then in one year from starting. Dr. Karma Greaser

## 2023-09-30 NOTE — Telephone Encounter (Signed)
Deductible:  $3300 ($0 met)   OOP MAX:  Annual exam: 08/28/23 EB   Calcium:       will see PCP (Dr. Abigail Miyamoto at [facility]) on 10/02/23      Date:  Upcoming dental procedures: No   Hx of Kidney Disease: No   Hx of heart attack or stroke in the last year: No  Last Bone Density Scan: 03/12/2022, will schedule prior to injections  Is Prior Authorization needed: yes, Auth #: [id number]. Approved 09/23/23 to 09/22/24.   Pt estimated Cost: $25 with copay card.

## 2023-10-06 NOTE — Telephone Encounter (Signed)
Order for external BMD signed by Dr. Karma Greaser and faxed to [facility] [place]. Fax #: [phone]. Will update patient.

## 2023-10-06 NOTE — Telephone Encounter (Signed)
Detailed message left per DPR advising patient that Letter of Medical Necessity will be faxed to insurance and order for BMD to Dr. Bonney Roussel desk to be signed. Will fax order once signed.

## 2023-10-19 ENCOUNTER — Encounter: Payer: Self-pay | Admitting: Obstetrics and Gynecology

## 2023-10-20 NOTE — Telephone Encounter (Signed)
Message left to return call to [place] at [phone].   Reviewed with [facility]. Will attach letter of medical necessity to BMD order or referral.

## 2023-10-21 NOTE — Telephone Encounter (Signed)
BMD was done on 10/14/23 and is available in Care Everywhere. Jacqueline Cordova was reported on the BMD:  FRAX 10-YEAR PROBABILITY OF FRACTURE:  10-year fracture risk is performed using the [employer]  FRAX calculator based on patient-reported risk factors.  Major osteoporotic fracture: 7.2%  Hip fracture: 0.8%.

## 2023-11-04 NOTE — Telephone Encounter (Signed)
 Spoke with patient. Advised will provide letter of medical necessity to patient, patient to provide to imaging center to send to their billing department. Patient request this letter to be mailed to address on file.   Patient appreciative of call.   Routing to [employer] to [employer].

## 2023-11-05 NOTE — Telephone Encounter (Signed)
 Call to patient. Advised letter of medical necessity placed in mail to go out to patient today. Patient verbalized understanding. Also confirmed with patient that she is holding off on Evenity injections for now, see message from Dr. Karma Greaser below. Patient states she is going to try OTC vitamin supplementation.   Encounter closed.

## 2023-11-05 NOTE — Telephone Encounter (Signed)
 Earley Favor, MD to Jacqueline Cordova      10/22/23  9:35 AM If you have had any fractures, you could start evenity.  However, since the Frax was normal, you can do the vitamins and exercise for now and repeat the bone scan in 2 years. Dr. Karma Greaser   Last read by Jacqueline Cordova at 12:10 PM on 10/22/2023. October 21, 2023      10/21/23  3:28 PM Blima Ledger, CMA routed this conversation to Earley Favor, MD Jacqueline Cordova to [facility] (supporting Earley Favor, MD)      10/21/23 12:37 PM Hi, thanks for the response.  Does that mean I do not need the Evenity treatment as discussed previously?

## 2023-11-10 ENCOUNTER — Encounter: Payer: Self-pay | Admitting: Obstetrics and Gynecology

## 2023-11-16 ENCOUNTER — Encounter: Payer: Self-pay | Admitting: Physician Assistant

## 2024-01-06 ENCOUNTER — Ambulatory Visit: Admitting: Physician Assistant

## 2024-01-06 ENCOUNTER — Encounter: Payer: Self-pay | Admitting: Physician Assistant

## 2024-01-06 VITALS — BP 124/70 | HR 85 | Ht 66.0 in | Wt 210.0 lb

## 2024-01-06 DIAGNOSIS — K649 Unspecified hemorrhoids: Secondary | ICD-10-CM

## 2024-01-06 DIAGNOSIS — K5900 Constipation, unspecified: Secondary | ICD-10-CM | POA: Diagnosis not present

## 2024-01-06 DIAGNOSIS — K625 Hemorrhage of anus and rectum: Secondary | ICD-10-CM

## 2024-01-06 NOTE — Progress Notes (Signed)
 Chief Complaint: IBS symptoms  HPI:    Jacqueline Cordova is a 58 year old female with a past medical history as listed below including reflux, known to Dr. Leonia Raman, who presents to clinic today with IBS symptoms.    11/18/2016 colonoscopy with one 4 mm polyp in the sigmoid colon, nonbleeding internal hemorrhoids and otherwise normal.  Pathology showed benign mucosa and repeat recommended 10 years.    Today, the patient describes that she has had constipation for decades.  It seems like it maybe has gotten worse over the past few years.  Currently she is only having a bowel movement once a week and that is after starting MiraLAX.  She only takes this as needed.  She tried Metamucil in the past which made it even worse and made her have terribly large painful bowel movements so she stopped this.  Occasionally tells me she sees some bright red blood in the toilet only after she has had a bowel movement after being constipated which usually last for a day or 2 and then goes away.  She has not had any bleeding recently.  She uses as needed over-the-counter Preparation H suppositories and witch hazel pads.  Also discusses a lot of gas associated with times of constipation and bloating.    Used to have a problem with heartburn but now she keeps head of her bed elevated and has no problems.    Originally from [place] and likes to go on lots of long vacations now that she is semiretired.    Denies fever, chills or weight loss.  Past Medical History:  Diagnosis Date   Corneal abrasion    GERD (gastroesophageal reflux disease)    Gestational diabetes    Osteopenia 09/2017   T score -1.4 FRAX 3.2% / 0.1%, 2023 -2.2 frax 5.5% repeat 2025    Past Surgical History:  Procedure Laterality Date   COLONOSCOPY     NASAL SEPTUM SURGERY      Current Outpatient Medications  Medication Sig Dispense Refill   ASPIRIN 81 PO Take by mouth.     atorvastatin (LIPITOR) 40 MG tablet Take 40 mg by mouth daily.      Calcium-Magnesium 200-100 MG TABS Take by mouth.     clobetasol (TEMOVATE) 0.05 % external solution APPLY TO AFFECTED AREA TWICE A DAY     co-enzyme Q-10 50 MG capsule Take 50 mg by mouth daily.     metFORMIN (GLUCOPHAGE) 500 MG tablet Take by mouth 2 (two) times daily with a meal.     Multiple Vitamins-Minerals (OCUVITE ADULT 50+ PO) Take by mouth.     Probiotic Product (PROBIOTIC-10) CHEW Chew by mouth.     VITAMIN D , CHOLECALCIFEROL, PO Take 2,000 Units by mouth.      No current facility-administered medications for this visit.    Allergies as of 01/06/2024 - Review Complete 09/28/2023  Allergen Reaction Noted   Erythromycin Swelling 11/04/2016   Morphine and codeine Hives 11/04/2016    Family History  Problem Relation Age of Onset   Hypertension Other    Diabetes Other    Colon cancer Maternal Grandmother    Cancer Maternal Grandmother        colon   Colon cancer Maternal Grandfather    Cancer Maternal Grandfather        colon   Breast cancer Mother    Diabetes Mother    Hypertension Mother    Hypertension Father    Esophageal cancer Neg Hx    Pancreatic  cancer Neg Hx    Rectal cancer Neg Hx    Stomach cancer Neg Hx     Social History   Socioeconomic History   Marital status: Married    Spouse name: Not on file   Number of children: Not on file   Years of education: Not on file   Highest education level: Not on file  Occupational History   Not on file  Tobacco Use   Smoking status: Never   Smokeless tobacco: Never  Vaping Use   Vaping status: Never Used  Substance and Sexual Activity   Alcohol use: Yes    Comment: occ   Drug use: No   Sexual activity: Yes    Partners: Male    Birth control/protection: Post-menopausal    Comment: 1st intercourse-23, partners- 3, married- 12 yrs   Other Topics Concern   Not on file  Social History Narrative   Not on file   Social Drivers of Health   Financial Resource Strain: Not on file  Food Insecurity: Not on  file  Transportation Needs: Not on file  Physical Activity: Not on file  Stress: Not on file  Social Connections: Not on file  Intimate Partner Violence: Not on file    Review of Systems:    Constitutional: No weight loss, fever or chills Skin: No rash Cardiovascular: No chest pain  Respiratory: No SOB  Gastrointestinal: See HPI and otherwise negative Genitourinary: No dysuria  Neurological: No headache, dizziness or syncope Musculoskeletal: No new muscle or joint pain Hematologic: No bruising Psychiatric: No history of depression or anxiety   Physical Exam:  Vital signs: BP 124/70   Pulse 85   Ht 5\' 6"  (1.676 m)   Wt 210 lb (95.3 kg)   BMI 33.89 kg/m    Constitutional:   Pleasant elderly Caucasian female appears to be in NAD, Well developed, Well nourished, alert and cooperative Head:  Normocephalic and atraumatic. Eyes:   PEERL, EOMI. No icterus. Conjunctiva pink. Ears:  Normal auditory acuity. Neck:  Supple Throat: Oral cavity and pharynx without inflammation, swelling or lesion.  Respiratory: Respirations even and unlabored. Lungs clear to auscultation bilaterally.   No wheezes, crackles, or rhonchi.  Cardiovascular: Normal S1, S2. No MRG. Regular rate and rhythm. No peripheral edema, cyanosis or pallor.  Gastrointestinal:  Soft, nondistended, nontender. No rebound or guarding. Normal bowel sounds. No appreciable masses or hepatomegaly. Rectal:  Not performed.  Msk:  Symmetrical without gross deformities. Without edema, no deformity or joint abnormality.  Neurologic:  Alert and  oriented x4;  grossly normal neurologically.  Skin:   Dry and intact without significant lesions or rashes. Psychiatric:  Demonstrates good judgement and reason without abnormal affect or behaviors.  RELEVANT LABS AND IMAGING: CBC    Component Value Date/Time   WBC 5.5 10/08/2017 0832   RBC 5.54 (H) 10/08/2017 0832   HGB 15.7 (H) 10/08/2017 0832   HCT 47.9 (H) 10/08/2017 0832   PLT 186  10/08/2017 0832   MCV 86.5 10/08/2017 0832   MCH 28.3 10/08/2017 0832   MCHC 32.8 10/08/2017 0832   RDW 12.7 10/08/2017 0832   LYMPHSABS 1,837 10/08/2017 0832   EOSABS 88 10/08/2017 0832   BASOSABS 39 10/08/2017 0832    CMP     Component Value Date/Time   NA 137 10/08/2017 0832   K 4.1 10/08/2017 0832   CL 104 10/08/2017 0832   CO2 23 10/08/2017 0832   GLUCOSE 108 (H) 10/08/2017 0832   BUN 15 10/08/2017  1610   CREATININE 0.95 10/08/2017 0832   CALCIUM 9.6 10/08/2017 0832   PROT 6.7 10/08/2017 0832   AST 21 10/08/2017 0832   ALT 33 (H) 10/08/2017 0832   BILITOT 1.4 (H) 10/08/2017 9604    Assessment: 1.  Constipation: Chronic for the patient, worsened over the past few years, is relieved after he takes a dose of MiraLAX but only has a bowel movement once a week; likely slow transit/IBS 2.  Hemorrhoids: With above, only bleed after she is able to have a bowel movement which is once a week to help buy over-the-counter Preparation H and witch hazel pads  Plan: 1.  Would recommend patient increase MiraLAX to daily.  Hopefully this will help with all of her other symptoms including gas and hemorrhoids. 2.  Discussed hemorrhoids, she declined evaluation today.  They are not currently bothering her.  Discussed that if she starts seeing blood without a bowel movement or they become painful then she needs to let us  know.  Otherwise continue over-the-counter products such as Preparation H and witch hazel pads. 3.  Discussed repeat colonoscopy due in 2028 unless she has other problems. 4.  Patient to follow in clinic with us  as needed.  Reginal Capra, PA-C [facility] 01/06/2024, 9:23 AM

## 2024-01-06 NOTE — Patient Instructions (Addendum)
 Start Miralax 1 capful daily in 8 ounces of liquid.   Call or send Mychart message with an update in 3-4 weeks.   _______________________________________________________  If your blood pressure at your visit was 140/90 or greater, please contact your primary care physician to follow up on this.  _______________________________________________________  If you are age 56 or older, your body mass index should be between 23-30. Your Body mass index is 33.89 kg/m. If this is out of the aforementioned range listed, please consider follow up with your Primary Care Provider.  If you are age 45 or younger, your body mass index should be between 19-25. Your Body mass index is 33.89 kg/m. If this is out of the aformentioned range listed, please consider follow up with your Primary Care Provider.   ________________________________________________________  The [facility] providers would like to encourage you to use MYCHART to communicate with providers for non-urgent requests or questions.  Due to long hold times on the telephone, sending your provider a message by [facility] may be a faster and more efficient way to get a response.  Please allow 48 business hours for a response.  Please remember that this is for non-urgent requests.  _______________________________________________________

## 2024-07-11 ENCOUNTER — Encounter: Payer: Self-pay | Admitting: Radiology

## 2024-09-12 ENCOUNTER — Encounter: Payer: Self-pay | Admitting: Obstetrics and Gynecology

## 2024-09-12 ENCOUNTER — Ambulatory Visit (INDEPENDENT_AMBULATORY_CARE_PROVIDER_SITE_OTHER): Admitting: Obstetrics and Gynecology

## 2024-09-12 VITALS — BP 122/72 | HR 84 | Ht 66.5 in | Wt 205.0 lb

## 2024-09-12 DIAGNOSIS — Z1331 Encounter for screening for depression: Secondary | ICD-10-CM

## 2024-09-12 DIAGNOSIS — Z01419 Encounter for gynecological examination (general) (routine) without abnormal findings: Secondary | ICD-10-CM

## 2024-09-12 DIAGNOSIS — Z9189 Other specified personal risk factors, not elsewhere classified: Secondary | ICD-10-CM | POA: Diagnosis not present

## 2024-09-12 NOTE — Progress Notes (Signed)
 No questions or concerns at this time

## 2024-09-12 NOTE — Progress Notes (Signed)
 "   59 y.o. y.o. female here for annual exam. No LMP recorded. Patient is postmenopausal.   G1P1L1 Married.  Daughter 78 yo.   RP:  Established patient presenting for annual gyn exam. Originally from [place].    HPI: Postmenopause, well on no HRT.   Now improved hot flushes. No pelvic pain.  No pain with intercourse.  Pap Neg 01/2023.  Will repeat Pap every 3 yrs.  No h/o abnormal Pap.  Urine and bowel movements normal.  Breasts normal.  Mammo Neg 2025 doing CT with contrast do to increased risk.  Body mass index 33.16 . BD 09/2017 Osteopenia T-Score -1.4 at the Spine, 2023 T-2.2 frax neg 5.5, 0.5 .  Repeat 2025 -2.2 normal frax. Started raloxifene. Has reflux.  No h/o fracture. Has constipation with calcium intake. Depo x 10 years, family history of osteoporosis. Has hip and shoulder pain. Referral placed to ortho. Health labs with family physician.  Dxed with DM type 2 and Hypercholesterolemia under treatment.  Colonoscopy in March 2018, 5-year schedule for benign polyps, will schedule this year.  Fam h/o Colon Ca. Mother with postmenopausal Breast Ca.  Patient had Genetic screening negative last year.  Seen by Oncology because of a High risk of Breast Ca per Radiology screening tool.  Has DM. She is currently on a statin. To get cardiac calcium score test. Has dermatologist for annual mole checks  Body mass index is 32.59 kg/m.     09/12/2024    1:40 PM 09/27/2019    5:36 PM  Depression screen PHQ 2/9  Decreased Interest 0 0  Down, Depressed, Hopeless 0 0  PHQ - 2 Score 0 0    Blood pressure 122/72, pulse 84, height 5' 6.5 (1.689 m), weight 205 lb (93 kg), SpO2 98%.     Component Value Date/Time   DIAGPAP  08/28/2023 0903    - Negative for intraepithelial lesion or malignancy (NILM)   DIAGPAP  01/11/2021 1237    - Negative for intraepithelial lesion or malignancy (NILM)   HPVHIGH Negative 08/28/2023 0903   ADEQPAP  08/28/2023 0903    Satisfactory for evaluation; transformation  zone component ABSENT.   ADEQPAP  01/11/2021 1237    Satisfactory for evaluation; transformation zone component PRESENT.    GYN HISTORY:    Component Value Date/Time   DIAGPAP  08/28/2023 0903    - Negative for intraepithelial lesion or malignancy (NILM)   DIAGPAP  01/11/2021 1237    - Negative for intraepithelial lesion or malignancy (NILM)   HPVHIGH Negative 08/28/2023 0903   ADEQPAP  08/28/2023 0903    Satisfactory for evaluation; transformation zone component ABSENT.   ADEQPAP  01/11/2021 1237    Satisfactory for evaluation; transformation zone component PRESENT.    OB History  Gravida Para Term Preterm AB Living  1 1    1   SAB IAB Ectopic Multiple Live Births          # Outcome Date GA Lbr Len/2nd Weight Sex Type Anes PTL Lv  1 Para             Past Medical History:  Diagnosis Date   Colon polyps    Corneal abrasion    DM (diabetes mellitus) (HCC)    type 2   GERD (gastroesophageal reflux disease)    Gestational diabetes    Osteopenia 09/2017   T score -1.4 FRAX 3.2% / 0.1%, 2023 -2.2 frax 5.5% repeat 2025    Past Surgical History:  Procedure Laterality Date  COLONOSCOPY     NASAL SEPTUM SURGERY      Medications Ordered Prior to Encounter[1]  Social History   Socioeconomic History   Marital status: Married    Spouse name: Not on file   Number of children: 1   Years of education: Not on file   Highest education level: Not on file  Occupational History   Not on file  Tobacco Use   Smoking status: Never   Smokeless tobacco: Never  Vaping Use   Vaping status: Never Used  Substance and Sexual Activity   Alcohol use: Yes    Comment: occ   Drug use: No   Sexual activity: Yes    Partners: Male    Birth control/protection: Post-menopausal    Comment: 1st intercourse-23, partners- 3, married- 12 yrs   Other Topics Concern   Not on file  Social History Narrative   Not on file   Social Drivers of Health   Tobacco Use: Low Risk (09/12/2024)    Patient History    Smoking Tobacco Use: Never    Smokeless Tobacco Use: Never    Passive Exposure: Not on file  Financial Resource Strain: Not on file  Food Insecurity: Not on file  Transportation Needs: Not on file  Physical Activity: Not on file  Stress: Not on file  Social Connections: Not on file  Intimate Partner Violence: Not on file  Depression (PHQ2-9): Low Risk (09/12/2024)   Depression (PHQ2-9)    PHQ-2 Score: 0  Alcohol Screen: Not on file  Housing: Not on file  Utilities: Not on file  Health Literacy: Not on file    Family History  Problem Relation Age of Onset   Breast cancer Mother    Diabetes Mother    Hypertension Mother    Hypertension Father    Colon cancer Maternal Grandmother    Colon cancer Maternal Grandfather    Hypertension Other    Diabetes Other    Esophageal cancer Neg Hx    Pancreatic cancer Neg Hx    Rectal cancer Neg Hx    Stomach cancer Neg Hx      Allergies[2]    Patient's last menstrual period was No LMP recorded. Patient is postmenopausal..           Review of Systems Alls systems reviewed and are negative.     Physical Exam Constitutional:      Appearance: Normal appearance.  Genitourinary:     Vulva and urethral meatus normal.     No lesions in the vagina.     Right Labia: No rash, lesions or skin changes.    Left Labia: No lesions, skin changes or rash.    No vaginal discharge or tenderness.     No vaginal prolapse present.    No vaginal atrophy present.     Right Adnexa: not tender, not palpable and no mass present.    Left Adnexa: not tender, not palpable and no mass present.    No cervical motion tenderness or discharge.     Uterus is not enlarged, tender or irregular.  Breasts:    Right: Normal.     Left: Normal.  HENT:     Head: Normocephalic.  Neck:     Thyroid: No thyroid mass, thyromegaly or thyroid tenderness.  Cardiovascular:     Rate and Rhythm: Normal rate and regular rhythm.     Heart sounds:  Normal heart sounds, S1 normal and S2 normal.  Pulmonary:     Effort: Pulmonary effort  is normal.     Breath sounds: Normal breath sounds and air entry.  Abdominal:     General: There is no distension.     Palpations: Abdomen is soft. There is no mass.     Tenderness: There is no abdominal tenderness. There is no guarding or rebound.  Musculoskeletal:        General: Normal range of motion.     Cervical back: Full passive range of motion without pain, normal range of motion and neck supple. No tenderness.     Right lower leg: No edema.     Left lower leg: No edema.  Neurological:     Mental Status: She is alert.  Skin:    General: Skin is warm.  Psychiatric:        Mood and Affect: Mood normal.        Behavior: Behavior normal.        Thought Content: Thought content normal.  Vitals and nursing note reviewed. Exam conducted with a chaperone present.       A:         Well Woman GYN exam                             P:        Pap smear not indicated Encouraged annual mammogram screening Colon cancer screening up-to-date DXA up-to-date Labs and immunizations to do with PMD Discussed breast self exams Encouraged healthy lifestyle practices Encouraged Vit D and Calcium   No follow-ups on file.  Jacqueline Cordova    [1]  Current Outpatient Medications on File Prior to Visit  Medication Sig Dispense Refill   ASPIRIN 81 PO Take by mouth.     atorvastatin (LIPITOR) 40 MG tablet Take 40 mg by mouth daily.     Calcium-Magnesium 200-100 MG TABS Take by mouth.     co-enzyme Q-10 50 MG capsule Take 50 mg by mouth daily.     metFORMIN (GLUCOPHAGE) 500 MG tablet Take by mouth 2 (two) times daily with a meal.     Multiple Vitamins-Minerals (OCUVITE ADULT 50+ PO) Take by mouth.     Probiotic Product (PROBIOTIC-10) CHEW Chew by mouth.     raloxifene (EVISTA) 60 MG tablet Take 60 mg by mouth daily.     VITAMIN D , CHOLECALCIFEROL, PO Take 2,000 Units by mouth.      clobetasol  (TEMOVATE) 0.05 % external solution APPLY TO AFFECTED AREA TWICE A DAY (Patient not taking: Reported on 09/12/2024)     No current facility-administered medications on file prior to visit.  [2]  Allergies Allergen Reactions   Erythromycin Swelling    Burning and redness Had for corneal abrasion   Morphine And Codeine Hives    Felt like my arms were on fire.  Burning of arms. Per pt. maw   "

## 2024-09-28 ENCOUNTER — Ambulatory Visit: Payer: Self-pay | Admitting: Obstetrics and Gynecology

## 2024-09-28 ENCOUNTER — Ambulatory Visit (HOSPITAL_BASED_OUTPATIENT_CLINIC_OR_DEPARTMENT_OTHER)
Admission: RE | Admit: 2024-09-28 | Discharge: 2024-09-28 | Disposition: A | Discharge status: Home / Self Care | Payer: Self-pay | Source: Ambulatory Visit | Attending: Obstetrics and Gynecology | Admitting: Obstetrics and Gynecology

## 2024-09-28 DIAGNOSIS — Z9189 Other specified personal risk factors, not elsewhere classified: Secondary | ICD-10-CM | POA: Insufficient documentation

## 2025-09-14 ENCOUNTER — Ambulatory Visit: Admitting: Obstetrics and Gynecology
